# Patient Record
Sex: Male | Born: 1945 | Race: Black or African American | Hispanic: No | Marital: Married | Smoking: Never smoker
Health system: Southern US, Community
[De-identification: ages and names within clinical notes are randomized; demographics above are authoritative.]

## PROBLEM LIST (undated history)

## (undated) DIAGNOSIS — Z860100 Personal history of colon polyps, unspecified: Secondary | ICD-10-CM

## (undated) DIAGNOSIS — I10 Essential (primary) hypertension: Secondary | ICD-10-CM

## (undated) DIAGNOSIS — I2510 Atherosclerotic heart disease of native coronary artery without angina pectoris: Secondary | ICD-10-CM

## (undated) DIAGNOSIS — N419 Inflammatory disease of prostate, unspecified: Secondary | ICD-10-CM

## (undated) DIAGNOSIS — C61 Malignant neoplasm of prostate: Secondary | ICD-10-CM

## (undated) DIAGNOSIS — E785 Hyperlipidemia, unspecified: Secondary | ICD-10-CM

## (undated) DIAGNOSIS — I639 Cerebral infarction, unspecified: Secondary | ICD-10-CM

## (undated) DIAGNOSIS — R42 Dizziness and giddiness: Secondary | ICD-10-CM

## (undated) HISTORY — PX: COLONOSCOPY: SHX174

## (undated) HISTORY — DX: Personal history of colon polyps, unspecified: Z86.0100

## (undated) HISTORY — DX: Inflammatory disease of prostate, unspecified: N41.9

## (undated) HISTORY — DX: Essential (primary) hypertension: I10

## (undated) HISTORY — PX: PROSTATE BIOPSY: SHX241

## (undated) HISTORY — DX: Hyperlipidemia, unspecified: E78.5

## (undated) HISTORY — DX: Personal history of colonic polyps: Z86.010

## (undated) HISTORY — DX: Atherosclerotic heart disease of native coronary artery without angina pectoris: I25.10

## (undated) HISTORY — DX: Cerebral infarction, unspecified: I63.9

## (undated) HISTORY — DX: Dizziness and giddiness: R42

## (undated) HISTORY — PX: CORONARY ANGIOPLASTY WITH STENT PLACEMENT: SHX49

---

## 2000-03-23 LAB — HM COLONOSCOPY

## 2004-08-29 ENCOUNTER — Ambulatory Visit: Payer: Self-pay | Admitting: Family Medicine

## 2011-01-08 ENCOUNTER — Encounter: Payer: Self-pay | Admitting: Internal Medicine

## 2011-01-08 ENCOUNTER — Ambulatory Visit (INDEPENDENT_AMBULATORY_CARE_PROVIDER_SITE_OTHER): Payer: Medicare HMO | Admitting: Internal Medicine

## 2011-01-08 VITALS — BP 122/80 | HR 60 | Temp 98.1°F | Resp 16 | Ht 69.5 in | Wt 162.0 lb

## 2011-01-08 DIAGNOSIS — G4733 Obstructive sleep apnea (adult) (pediatric): Secondary | ICD-10-CM

## 2011-01-08 DIAGNOSIS — IMO0002 Reserved for concepts with insufficient information to code with codable children: Secondary | ICD-10-CM

## 2011-01-08 DIAGNOSIS — Z0000 Encounter for general adult medical examination without abnormal findings: Secondary | ICD-10-CM

## 2011-01-08 DIAGNOSIS — Z8673 Personal history of transient ischemic attack (TIA), and cerebral infarction without residual deficits: Secondary | ICD-10-CM

## 2011-01-08 DIAGNOSIS — E785 Hyperlipidemia, unspecified: Secondary | ICD-10-CM

## 2011-01-08 DIAGNOSIS — I2510 Atherosclerotic heart disease of native coronary artery without angina pectoris: Secondary | ICD-10-CM

## 2011-01-08 DIAGNOSIS — Z23 Encounter for immunization: Secondary | ICD-10-CM

## 2011-01-08 DIAGNOSIS — H8110 Benign paroxysmal vertigo, unspecified ear: Secondary | ICD-10-CM

## 2011-01-08 DIAGNOSIS — I10 Essential (primary) hypertension: Secondary | ICD-10-CM

## 2011-01-08 MED ORDER — ROSUVASTATIN CALCIUM 5 MG PO TABS: 5.0000 mg | ORAL_TABLET | Freq: Every day | ORAL | Status: DC

## 2011-01-08 MED ORDER — CLOPIDOGREL BISULFATE 75 MG PO TABS: 75.0000 mg | ORAL_TABLET | Freq: Every day | ORAL | Status: DC

## 2011-01-08 MED ORDER — VARDENAFIL HCL 20 MG PO TABS: 20.0000 mg | ORAL_TABLET | ORAL | Status: DC | PRN

## 2011-01-08 NOTE — Progress Notes (Signed)
Subjective:    Patient ID: Ian Roman, male    DOB: 1945/12/05, 65 y.o.   MRN: [id number]  HPI  65 year old patient who is seen today to establish with our practice. He states that he was evaluated in 07-Aug-2000 for a TIA. He has been on Plavix since that time. In Aug 07, 2005 he is status post coronary angiography with stenting of 2 vessels. He remains quite active and denies any cardiopulmonary symptoms. He has treated hypertension and dyslipidemia. He retired in June and has self discontinued metoprolol and hydrochlorothiazide.  1. Risk factors, based on past  M,S,F history. Patient has known coronary artery disease. Cardiovascular as factors include hypertension dyslipidemia  2.  Physical activities: Remains quite active without exercise restrictions exercises on a regular basis  3.  Depression/mood: No history depression or mood disorder 4.  Hearing: No deficits  5.  ADL's: Independent in all aspects of daily living  6.  Fall risk: Low  7.  Home safety: No problems identified  8.  Height weight, and visual acuity; height and weight stable no change in visual acuity  9.  Counseling: Heart healthy diet encouraged regular exercise recommended home blood pressure monitoring strongly encouraged  10. Lab orders based on risk factors: Will check lipid profile and general screening lab in the spring  11. Referral: Not appropriate at this time  12. Care plan:  13. Cognitive assessment:     Past medical history:  Coronary artery disease status post stenting 2005-08-07 History of TIA 08-07-00 Positional vertigo Hypertension Dyslipidemia Obstructive sleep apnea ED History pneumonia 08-07-2008 Chronic prostatitis Colonic polyps status post polypectomy 07-Aug-2000  Family history father died age 41 prostate cancer Mother died age 3 congestive heart failure 3 older sisters one died of a CNS neoplasm  Social history retired June 2012 25 year resident of [place] nonsmoker Married. One stepdaughter one  grandchild    Review of Systems  Constitutional: Negative for fever, chills, activity change, appetite change and fatigue.  HENT: Negative for hearing loss, ear pain, congestion, rhinorrhea, sneezing, mouth sores, trouble swallowing, neck pain, neck stiffness, dental problem, voice change, sinus pressure and tinnitus.        Chronic positional vertigo with lying on his right side only  Eyes: Negative for photophobia, pain, redness and visual disturbance.  Respiratory: Negative for apnea, cough, choking, chest tightness, shortness of breath and wheezing.   Cardiovascular: Negative for chest pain, palpitations and leg swelling.  Gastrointestinal: Negative for nausea, vomiting, abdominal pain, diarrhea, constipation, blood in stool, abdominal distention, anal bleeding and rectal pain.  Genitourinary: Negative for dysuria, urgency, frequency, hematuria, flank pain, decreased urine volume, discharge, penile swelling, scrotal swelling, difficulty urinating, genital sores and testicular pain.  Musculoskeletal: Negative for myalgias, back pain, joint swelling, arthralgias and gait problem.  Skin: Negative for color change, rash and wound.  Neurological: Negative for dizziness, tremors, seizures, syncope, facial asymmetry, speech difficulty, weakness, light-headedness, numbness and headaches.  Hematological: Negative for adenopathy. Does not bruise/bleed easily.  Psychiatric/Behavioral: Negative for suicidal ideas, hallucinations, behavioral problems, confusion, sleep disturbance, self-injury, dysphoric mood, decreased concentration and agitation. The patient is not nervous/anxious.        Objective:   Physical Exam  Constitutional: He appears well-developed and well-nourished.  HENT:  Head: Normocephalic and atraumatic.  Right Ear: External ear normal.  Left Ear: External ear normal.  Nose: Nose normal.  Mouth/Throat: Oropharynx is clear and moist.  Eyes: Conjunctivae and EOM are normal. Pupils  are equal, round, and reactive to light.  No scleral icterus.  Neck: Normal range of motion. Neck supple. No JVD present. No thyromegaly present.  Cardiovascular: Regular rhythm, normal heart sounds and intact distal pulses.  Exam reveals no gallop and no friction rub.   No murmur heard. Pulmonary/Chest: Effort normal and breath sounds normal. He exhibits no tenderness.  Abdominal: Soft. Bowel sounds are normal. He exhibits no distension and no mass. There is no tenderness.  Genitourinary: Prostate normal and penis normal.  Musculoskeletal: Normal range of motion. He exhibits no edema and no tenderness.  Lymphadenopathy:    He has no cervical adenopathy.  Neurological: He is alert. He has normal reflexes. No cranial nerve deficit. Coordination normal.  Skin: Skin is warm and dry. No rash noted.  Psychiatric: He has a normal mood and affect. His behavior is normal.          Assessment & Plan:   Preventive health examination Coronary artery disease Hypertension. His blood pressure is well controlled today off medications we'll continue close home but pressure monitoring low-salt diet Obstructive sleep apnea  Recheck in 6 months with fasting lab at that time. We'll schedule followup colonoscopy at that time

## 2011-01-08 NOTE — Patient Instructions (Signed)
It is important that you exercise regularly, at least 20 minutes 3 to 4 times per week.  If you develop chest pain or shortness of breath seek  medical attention.  Limit your sodium (Salt) intake  Schedule your colonoscopy to help detect colon cancer.  Return in 6 months for follow-up

## 2011-03-09 ENCOUNTER — Telehealth: Payer: Self-pay

## 2011-03-09 NOTE — Telephone Encounter (Signed)
Pt was on viagra 50 mg previously. Pt states he was on Levitra 20 mg but he needs 50 mg.  Pls advise.

## 2011-03-09 NOTE — Telephone Encounter (Signed)
Maximum dose of Viagra is 100 mg and maximum dose of Levitra is 20 mg. Which medication that he prefer??

## 2011-03-09 NOTE — Telephone Encounter (Signed)
Pt is aware of information.  Pt states he will stay on Levitra 20 mg for now and will call back if he needs to changed medications.

## 2011-03-19 ENCOUNTER — Other Ambulatory Visit: Payer: Self-pay | Admitting: Internal Medicine

## 2011-03-19 MED ORDER — METOPROLOL SUCCINATE ER 25 MG PO TB24: 25.0000 mg | ORAL_TABLET | Freq: Every day | ORAL | Status: DC

## 2011-03-19 NOTE — Telephone Encounter (Signed)
Not on current med list - was dc'd 12/2010 Please advise

## 2011-03-19 NOTE — Telephone Encounter (Signed)
RF  #90  RF 2

## 2011-03-19 NOTE — Telephone Encounter (Signed)
Pt requesting refill on  metoprolol tartrate (LOPRESSOR) 25 MG tablet   [facility] [place]

## 2011-04-01 DIAGNOSIS — K219 Gastro-esophageal reflux disease without esophagitis: Secondary | ICD-10-CM | POA: Insufficient documentation

## 2011-04-01 DIAGNOSIS — G459 Transient cerebral ischemic attack, unspecified: Secondary | ICD-10-CM | POA: Insufficient documentation

## 2011-04-01 DIAGNOSIS — N419 Inflammatory disease of prostate, unspecified: Secondary | ICD-10-CM | POA: Insufficient documentation

## 2011-06-09 ENCOUNTER — Encounter: Payer: Self-pay | Admitting: Internal Medicine

## 2011-06-09 ENCOUNTER — Ambulatory Visit (INDEPENDENT_AMBULATORY_CARE_PROVIDER_SITE_OTHER): Payer: Medicare HMO | Admitting: Internal Medicine

## 2011-06-09 VITALS — BP 120/80 | HR 68 | Temp 97.7°F | Wt 167.0 lb

## 2011-06-09 DIAGNOSIS — E785 Hyperlipidemia, unspecified: Secondary | ICD-10-CM

## 2011-06-09 DIAGNOSIS — R42 Dizziness and giddiness: Secondary | ICD-10-CM

## 2011-06-09 DIAGNOSIS — I2510 Atherosclerotic heart disease of native coronary artery without angina pectoris: Secondary | ICD-10-CM

## 2011-06-09 DIAGNOSIS — I10 Essential (primary) hypertension: Secondary | ICD-10-CM

## 2011-06-09 LAB — CBC WITH DIFFERENTIAL/PLATELET
Basophils Absolute: 0 10*3/uL (ref 0.0–0.1)
Eosinophils Relative: 2 % (ref 0.0–5.0)
Hemoglobin: 14 g/dL (ref 13.0–17.0)
Lymphocytes Relative: 23.1 % (ref 12.0–46.0)
Monocytes Relative: 9.4 % (ref 3.0–12.0)
Platelets: 220 10*3/uL (ref 150.0–400.0)
RDW: 11.4 % — ABNORMAL LOW (ref 11.5–14.6)
WBC: 6.1 10*3/uL (ref 4.5–10.5)

## 2011-06-09 LAB — COMPREHENSIVE METABOLIC PANEL
ALT: 21 U/L (ref 0–53)
CO2: 30 mEq/L (ref 19–32)
Calcium: 9.7 mg/dL (ref 8.4–10.5)
Chloride: 101 mEq/L (ref 96–112)
Creatinine, Ser: 1.1 mg/dL (ref 0.4–1.5)
GFR: 87.1 mL/min (ref >60.00)
Glucose, Bld: 94 mg/dL (ref 70–99)
Total Bilirubin: 1.2 mg/dL (ref 0.3–1.2)
Total Protein: 8.1 g/dL (ref 6.0–8.3)

## 2011-06-09 LAB — TSH: TSH: 0.64 u[IU]/mL (ref 0.35–5.50)

## 2011-06-09 LAB — LIPID PANEL
HDL: 43.3 mg/dL (ref >39.00)
Total CHOL/HDL Ratio: 4
Triglycerides: 83 mg/dL (ref 0.0–149.0)

## 2011-06-09 MED ORDER — METOPROLOL SUCCINATE ER 25 MG PO TB24: ORAL_TABLET | ORAL | Status: DC

## 2011-06-09 NOTE — Progress Notes (Signed)
  Subjective:    Patient ID: Ian Roman, male    DOB: June 10, 1945, 66 y.o.   MRN: [id number]  HPI  66 year old patient who has a history of coronary artery disease.  He established with our practice last fall and was resumed on metoprolol and hydrochlorothiazide. He continues to monitor home blood pressure readings with nice results. Last week while visiting in [place] he had 2 episodes of near syncope. These sound vasovagal with nausea diaphoresis associated with these episodes. Both occurred while eating breakfast.  He has had no recurrent symptoms over the past few days. Blood pressure readings have consistently been in a low normal range. He does have a history of positional vertigo but these episodes are different. Denies any exertional chest pain or shortness of breath.    Review of Systems  Constitutional: Negative for fever, chills, appetite change and fatigue.  HENT: Negative for hearing loss, ear pain, congestion, sore throat, trouble swallowing, neck stiffness, dental problem, voice change and tinnitus.   Eyes: Negative for pain, discharge and visual disturbance.  Respiratory: Negative for cough, chest tightness, wheezing and stridor.   Cardiovascular: Negative for chest pain, palpitations and leg swelling.  Gastrointestinal: Negative for nausea, vomiting, abdominal pain, diarrhea, constipation, blood in stool and abdominal distention.  Genitourinary: Negative for urgency, hematuria, flank pain, discharge, difficulty urinating and genital sores.  Musculoskeletal: Negative for myalgias, back pain, joint swelling, arthralgias and gait problem.  Skin: Negative for rash.  Neurological: Positive for weakness and light-headedness. Negative for dizziness, syncope, speech difficulty, numbness and headaches.  Hematological: Negative for adenopathy. Does not bruise/bleed easily.  Psychiatric/Behavioral: Negative for behavioral problems and dysphoric mood. The patient is not  nervous/anxious.        Objective:   Physical Exam  Constitutional: He is oriented to person, place, and time. He appears well-developed.  HENT:  Head: Normocephalic.  Right Ear: External ear normal.  Left Ear: External ear normal.  Eyes: Conjunctivae and EOM are normal.  Neck: Normal range of motion.  Cardiovascular: Normal rate, regular rhythm, normal heart sounds and intact distal pulses.   Pulmonary/Chest: Effort normal and breath sounds normal. No respiratory distress. He has no wheezes.  Abdominal: Bowel sounds are normal.  Musculoskeletal: Normal range of motion. He exhibits no edema and no tenderness.  Neurological: He is alert and oriented to person, place, and time.  Psychiatric: He has a normal mood and affect. His behavior is normal.          Assessment & Plan:   Vasovagal near syncope. We'll discontinue hydrochlorothiazide we'll check screening lab and EKG. Coronary artery disease stable Hypertension continue home blood pressure monitoring. Discontinue diuretic therapy

## 2011-06-09 NOTE — Patient Instructions (Addendum)
Discontinue hydrochlorothiazide  Please check your blood pressure on a regular basis.  If it is consistently greater than 150/90, please make an office appointment.    It is important that you exercise regularly, at least 20 minutes 3 to 4 times per week.  If you develop chest pain or shortness of breath seek  medical attention.  Decrease metoprolol to one half tablet daily

## 2011-07-10 ENCOUNTER — Encounter: Payer: Self-pay | Admitting: Internal Medicine

## 2011-07-10 ENCOUNTER — Ambulatory Visit (INDEPENDENT_AMBULATORY_CARE_PROVIDER_SITE_OTHER): Payer: Medicare HMO | Admitting: Internal Medicine

## 2011-07-10 VITALS — BP 110/70 | Temp 97.6°F | Wt 166.0 lb

## 2011-07-10 DIAGNOSIS — E785 Hyperlipidemia, unspecified: Secondary | ICD-10-CM

## 2011-07-10 DIAGNOSIS — I2510 Atherosclerotic heart disease of native coronary artery without angina pectoris: Secondary | ICD-10-CM

## 2011-07-10 MED ORDER — SILDENAFIL CITRATE 100 MG PO TABS: 100.0000 mg | ORAL_TABLET | ORAL | Status: DC | PRN

## 2011-07-10 NOTE — Patient Instructions (Signed)
It is important that you exercise regularly, at least 20 minutes 3 to 4 times per week.  If you develop chest pain or shortness of breath seek  medical attention.  Limit your sodium (Salt) intake  Return in 6 months for follow-up  

## 2011-07-10 NOTE — Progress Notes (Signed)
  Subjective:    Patient ID: Ian Roman, male    DOB: 03/27/1945, 66 y.o.   MRN: [id number]  HPI  is a 66 year old patient who is seen today for followup. He has a history of coronary artery disease hypertension and dyslipidemia. He is seen one-month ago with mild vasovagal symptoms. Blood pressure was low normal and diuretic therapy was discontinued. His blood pressure remains well-controlled off hydrochlorothiazide. He has had no recurrent symptoms. He does have some mild chronic vertigo that is unchanged. No significant orthostatic symptoms. His coronary artery disease and cerebrovascular disease has been stable    Review of Systems  Constitutional: Negative for fever, chills, appetite change and fatigue.  HENT: Negative for hearing loss, ear pain, congestion, sore throat, trouble swallowing, neck stiffness, dental problem, voice change and tinnitus.   Eyes: Negative for pain, discharge and visual disturbance.  Respiratory: Negative for cough, chest tightness, wheezing and stridor.   Cardiovascular: Negative for chest pain, palpitations and leg swelling.  Gastrointestinal: Negative for nausea, vomiting, abdominal pain, diarrhea, constipation, blood in stool and abdominal distention.  Genitourinary: Negative for urgency, hematuria, flank pain, discharge, difficulty urinating and genital sores.  Musculoskeletal: Negative for myalgias, back pain, joint swelling, arthralgias and gait problem.  Skin: Negative for rash.  Neurological: Positive for light-headedness. Negative for dizziness, syncope, speech difficulty, weakness, numbness and headaches.  Hematological: Negative for adenopathy. Does not bruise/bleed easily.  Psychiatric/Behavioral: Negative for behavioral problems and dysphoric mood. The patient is not nervous/anxious.        Objective:   Physical Exam  Constitutional: He is oriented to person, place, and time. He appears well-developed.       Blood pressure 100/70 sitting and  94/70 standing. No symptoms  HENT:  Head: Normocephalic.  Right Ear: External ear normal.  Left Ear: External ear normal.  Eyes: Conjunctivae and EOM are normal.  Neck: Normal range of motion.  Cardiovascular: Normal rate and normal heart sounds.   Pulmonary/Chest: Breath sounds normal.  Abdominal: Bowel sounds are normal.  Musculoskeletal: Normal range of motion. He exhibits no edema and no tenderness.  Neurological: He is alert and oriented to person, place, and time.  Psychiatric: He has a normal mood and affect. His behavior is normal.          Assessment & Plan:   Coronary artery disease Dyslipidemia  Patient has done well off hydrochlorothiazide this medication has been discontinued. We'll continue exercise program and active lifestyle. Medical regimen unchanged except Viagra will be substituted for Levitra which has been more effective. We'll see for his annual exam in 6 months

## 2011-08-24 ENCOUNTER — Ambulatory Visit (INDEPENDENT_AMBULATORY_CARE_PROVIDER_SITE_OTHER): Payer: Medicare HMO | Admitting: Internal Medicine

## 2011-08-24 ENCOUNTER — Encounter: Payer: Self-pay | Admitting: Internal Medicine

## 2011-08-24 VITALS — BP 114/70 | Temp 98.3°F | Wt 167.0 lb

## 2011-08-24 DIAGNOSIS — I2510 Atherosclerotic heart disease of native coronary artery without angina pectoris: Secondary | ICD-10-CM

## 2011-08-24 DIAGNOSIS — E785 Hyperlipidemia, unspecified: Secondary | ICD-10-CM

## 2011-08-24 DIAGNOSIS — I10 Essential (primary) hypertension: Secondary | ICD-10-CM

## 2011-08-24 NOTE — Patient Instructions (Signed)
Get plenty of rest, Drink lots of  clear liquids, and use Tylenol or ibuprofen for fever and discomfort.    Call or return to clinic prn if these symptoms worsen or fail to improve as anticipated.  

## 2011-08-24 NOTE — Progress Notes (Signed)
  Subjective:    Patient ID: Ian Roman, male    DOB: 1945/09/19, 66 y.o.   MRN: [id number]  HPI  66 year old patient who is followed for coronary artery disease dyslipidemia and hypertension. He has remote history of pneumonia. For the past 3 days, he has had chest congestion and cough;  cough described as thick white and occasionally yellow tinged. No shortness of breath,  wheezing chest pain. He is quite concerned about the possibility of recurrent pneumonia    Review of Systems  Constitutional: Negative for fever, chills, appetite change and fatigue.  HENT: Positive for congestion. Negative for hearing loss, ear pain, sore throat, trouble swallowing, neck stiffness, dental problem, voice change and tinnitus.   Eyes: Negative for pain, discharge and visual disturbance.  Respiratory: Positive for cough. Negative for chest tightness, wheezing and stridor.   Cardiovascular: Negative for chest pain, palpitations and leg swelling.  Gastrointestinal: Negative for nausea, vomiting, abdominal pain, diarrhea, constipation, blood in stool and abdominal distention.  Genitourinary: Negative for urgency, hematuria, flank pain, discharge, difficulty urinating and genital sores.  Musculoskeletal: Negative for myalgias, back pain, joint swelling, arthralgias and gait problem.  Skin: Negative for rash.  Neurological: Negative for dizziness, syncope, speech difficulty, weakness, numbness and headaches.  Hematological: Negative for adenopathy. Does not bruise/bleed easily.  Psychiatric/Behavioral: Negative for behavioral problems and dysphoric mood. The patient is not nervous/anxious.        Objective:   Physical Exam  Constitutional: He is oriented to person, place, and time. He appears well-developed.  HENT:  Head: Normocephalic.  Right Ear: External ear normal.  Left Ear: External ear normal.  Eyes: Conjunctivae and EOM are normal.  Neck: Normal range of motion.  Cardiovascular: Normal rate  and normal heart sounds.   Pulmonary/Chest: Breath sounds normal. No respiratory distress. He has no wheezes. He has no rales.        Oxygen saturation 97% Pulse rate is 67  Abdominal: Bowel sounds are normal.  Musculoskeletal: Normal range of motion. He exhibits no edema and no tenderness.  Neurological: He is alert and oriented to person, place, and time.  Psychiatric: He has a normal mood and affect. His behavior is normal.          Assessment & Plan:   Viral URI. Will treat symptomatically Hypertension stable Coronary artery disease stable

## 2011-08-31 ENCOUNTER — Ambulatory Visit (INDEPENDENT_AMBULATORY_CARE_PROVIDER_SITE_OTHER): Payer: Medicare HMO | Admitting: Internal Medicine

## 2011-08-31 ENCOUNTER — Encounter: Payer: Self-pay | Admitting: Internal Medicine

## 2011-08-31 VITALS — BP 120/70 | Temp 98.1°F | Wt 165.0 lb

## 2011-08-31 DIAGNOSIS — I10 Essential (primary) hypertension: Secondary | ICD-10-CM

## 2011-08-31 DIAGNOSIS — J40 Bronchitis, not specified as acute or chronic: Secondary | ICD-10-CM

## 2011-08-31 MED ORDER — HYDROCODONE-HOMATROPINE 5-1.5 MG/5ML PO SYRP: 5.0000 mL | ORAL_SOLUTION | Freq: Four times a day (QID) | ORAL | Status: DC | PRN

## 2011-08-31 MED ORDER — AZITHROMYCIN 250 MG PO TABS: ORAL_TABLET | ORAL | Status: AC

## 2011-08-31 NOTE — Progress Notes (Signed)
  Subjective:    Patient ID: Ian Roman, male    DOB: [birth year], 66 y.o.   MRN: [id number]  HPI 66 year old patient who is seen today in followup. He was seen 66 days ago with a three-day history of cough he was treated symptomatically. He continues to have cough congestion but no fever cough is been quite bothersome especially at night still largely nonproductive. No fever chills shortness of breath or chest pain. Has also noted some nasal congestion. He does have a history of OSA and prior history of pneumonia. He has treated coronary artery disease hypertension and dyslipidemia     Review of Systems  Constitutional: Positive for fatigue. Negative for fever, chills and appetite change.  HENT: Positive for congestion. Negative for hearing loss, ear pain, sore throat, trouble swallowing, neck stiffness, dental problem, voice change and tinnitus.   Eyes: Negative for pain, discharge and visual disturbance.  Respiratory: Positive for cough. Negative for chest tightness, wheezing and stridor.   Cardiovascular: Negative for chest pain, palpitations and leg swelling.  Gastrointestinal: Negative for nausea, vomiting, abdominal pain, diarrhea, constipation, blood in stool and abdominal distention.  Genitourinary: Negative for urgency, hematuria, flank pain, discharge, difficulty urinating and genital sores.  Musculoskeletal: Negative for myalgias, back pain, joint swelling, arthralgias and gait problem.  Skin: Negative for rash.  Neurological: Negative for dizziness, syncope, speech difficulty, weakness, numbness and headaches.  Hematological: Negative for adenopathy. Does not bruise/bleed easily.  Psychiatric/Behavioral: Negative for behavioral problems and dysphoric mood. The patient is not nervous/anxious.        Objective:   Physical Exam  Constitutional: He is oriented to person, place, and time. He appears well-developed.  HENT:  Head: Normocephalic.  Right Ear: External ear normal.    Left Ear: External ear normal.  Eyes: Conjunctivae and EOM are normal.  Neck: Normal range of motion.  Cardiovascular: Normal rate and normal heart sounds.   Pulmonary/Chest: Effort normal and breath sounds normal. No respiratory distress. He has no wheezes.       A few rhonchi O2 saturation 97% Pulse rate 70  Abdominal: Bowel sounds are normal.  Musculoskeletal: Normal range of motion. He exhibits no edema and no tenderness.  Neurological: He is alert and oriented to person, place, and time.  Psychiatric: He has a normal mood and affect. His behavior is normal.          Assessment & Plan:  URI/bronchitis. We'll treat with a Z-Pak. Continue symptomatic treatment with a cough suppressant Hypertension stable

## 2011-08-31 NOTE — Patient Instructions (Signed)
    Use saline irrigation, warm  moist compresses and over-the-counter decongestants only as directed.  Call if there is no improvement in 5 to 7 days, or sooner if you develop increasing pain, fever, or any new symptoms.  Take your antibiotic as prescribed until ALL of it is gone, but stop if you develop a rash, swelling, or any side effects of the medication.  Contact our office as soon as possible if  there are side effects of the medication. 

## 2011-12-02 ENCOUNTER — Encounter: Payer: Self-pay | Admitting: Internal Medicine

## 2011-12-02 ENCOUNTER — Ambulatory Visit (INDEPENDENT_AMBULATORY_CARE_PROVIDER_SITE_OTHER): Payer: Medicare HMO | Admitting: Internal Medicine

## 2011-12-02 VITALS — BP 132/70 | Temp 98.1°F | Ht 70.0 in | Wt 165.0 lb

## 2011-12-02 DIAGNOSIS — N139 Obstructive and reflux uropathy, unspecified: Secondary | ICD-10-CM

## 2011-12-02 DIAGNOSIS — N401 Enlarged prostate with lower urinary tract symptoms: Secondary | ICD-10-CM | POA: Insufficient documentation

## 2011-12-02 DIAGNOSIS — Z86010 Personal history of colonic polyps: Secondary | ICD-10-CM

## 2011-12-02 DIAGNOSIS — Z0000 Encounter for general adult medical examination without abnormal findings: Secondary | ICD-10-CM

## 2011-12-02 DIAGNOSIS — I2510 Atherosclerotic heart disease of native coronary artery without angina pectoris: Secondary | ICD-10-CM

## 2011-12-02 MED ORDER — ASPIRIN EC 81 MG PO TBEC: 81.0000 mg | DELAYED_RELEASE_TABLET | Freq: Every day | ORAL | Status: AC

## 2011-12-02 NOTE — Patient Instructions (Signed)
Limit your sodium (Salt) intake    It is important that you exercise regularly, at least 20 minutes 3 to 4 times per week.  If you develop chest pain or shortness of breath seek  medical attention.  Return in 6 months for follow-up  

## 2011-12-02 NOTE — Progress Notes (Signed)
Patient ID: Ian Roman, male   DOB: 1945-12-12, 66 y.o.   MRN: [id number]  Subjective:    Patient ID: Ian Roman, male    DOB: 01/04/1946, 66 y.o.   MRN: [id number]  Coronary Artery Disease Pertinent negatives include no chest pain, chest tightness, dizziness, leg swelling, palpitations or shortness of breath.  66 -year-old patient who is seen today for an annual examination. He states that he was evaluated in 2000-08-30 for a TIA. He has been on Plavix since that time. In Aug 30, 2005 he is status post coronary angiography with stenting of 2 vessels. He remains quite active and denies any cardiopulmonary symptoms. He has treated hypertension and dyslipidemia. He retired in June 2012 and has self discontinued hydrochlorothiazide.  He remains on metoprolol  He has a history of colonic polyps and last colonoscopy was in February of 2009  1. Risk factors, based on past  M,S,F history. Patient has known coronary artery disease. Cardiovascular as factors include hypertension dyslipidemia  2.  Physical activities: Remains quite active without exercise restrictions exercises on a regular basis  3.  Depression/mood: No history depression or mood disorder 4.  Hearing: No deficits  5.  ADL's: Independent in all aspects of daily living  6.  Fall risk: Low  7.  Home safety: No problems identified  8.  Height weight, and visual acuity; height and weight stable no change in visual acuity  9.  Counseling: Heart healthy diet encouraged regular exercise recommended home blood pressure monitoring strongly encouraged  10. Lab orders based on risk factors: Will check lipid profile and general screening lab in the spring  11. Referral: Not appropriate at this time  12. Care plan:  13. Cognitive assessment:     Past medical history:  Coronary artery disease status post stenting Aug 30, 2005 History of TIA 30-Aug-2000 Positional vertigo Hypertension Dyslipidemia Obstructive sleep apnea ED History pneumonia  30-Aug-2008 Chronic prostatitis Colonic polyps status post polypectomy 08/30/2000  Family history father died age 70  prostate cancer Mother died age 28 congestive heart failure 3 older sisters one died of a CNS neoplasm  Social history retired June 2012 25 year resident of [place] nonsmoker Married. One stepdaughter one grandchild    Review of Systems  Constitutional: Negative for fever, chills, activity change, appetite change and fatigue.  HENT: Negative for hearing loss, ear pain, congestion, rhinorrhea, sneezing, mouth sores, trouble swallowing, neck pain, neck stiffness, dental problem, voice change, sinus pressure and tinnitus.        Chronic positional vertigo with lying on his right side only  Eyes: Negative for photophobia, pain, redness and visual disturbance.  Respiratory: Negative for apnea, cough, choking, chest tightness, shortness of breath and wheezing.   Cardiovascular: Negative for chest pain, palpitations and leg swelling.  Gastrointestinal: Negative for nausea, vomiting, abdominal pain, diarrhea, constipation, blood in stool, abdominal distention, anal bleeding and rectal pain.  Genitourinary: Negative for dysuria, urgency, frequency, hematuria, flank pain, decreased urine volume, discharge, penile swelling, scrotal swelling, difficulty urinating, genital sores and testicular pain.  Musculoskeletal: Negative for myalgias, back pain, joint swelling, arthralgias and gait problem.  Skin: Negative for color change, rash and wound.  Neurological: Negative for dizziness, tremors, seizures, syncope, facial asymmetry, speech difficulty, weakness, light-headedness, numbness and headaches.  Hematological: Negative for adenopathy. Does not bruise/bleed easily.  Psychiatric/Behavioral: Negative for suicidal ideas, hallucinations, behavioral problems, confusion, disturbed wake/sleep cycle, self-injury, dysphoric mood, decreased concentration and agitation. The patient is not nervous/anxious.  Objective:   Physical Exam  Constitutional: He appears well-developed and well-nourished.  HENT:  Head: Normocephalic and atraumatic.  Right Ear: External ear normal.  Left Ear: External ear normal.  Nose: Nose normal.  Mouth/Throat: Oropharynx is clear and moist.  Eyes: Conjunctivae normal and EOM are normal. Pupils are equal, round, and reactive to light. No scleral icterus.  Neck: Normal range of motion. Neck supple. No JVD present. No thyromegaly present.  Cardiovascular: Regular rhythm, normal heart sounds and intact distal pulses.  Exam reveals no gallop and no friction rub.   No murmur heard. Pulmonary/Chest: Effort normal and breath sounds normal. He exhibits no tenderness.  Abdominal: Soft. Bowel sounds are normal. He exhibits no distension and no mass. There is no tenderness.  Genitourinary: Prostate normal and penis normal.  Musculoskeletal: Normal range of motion. He exhibits no edema and no tenderness.  Lymphadenopathy:    He has no cervical adenopathy.  Neurological: He is alert. He has normal reflexes. No cranial nerve deficit. Coordination normal.  Skin: Skin is warm and dry. No rash noted.  Psychiatric: He has a normal mood and affect. His behavior is normal.          Assessment & Plan:   Preventive health examination Coronary artery disease Hypertension. His blood pressure is well controlled today off medications we'll continue close home but pressure monitoring low-salt diet Obstructive sleep apnea  Recheck in 6 months with fasting lab at that time. We'll schedule followup colonoscopy at that time

## 2011-12-16 ENCOUNTER — Ambulatory Visit (INDEPENDENT_AMBULATORY_CARE_PROVIDER_SITE_OTHER): Payer: Medicare HMO | Admitting: Internal Medicine

## 2011-12-16 ENCOUNTER — Encounter: Payer: Self-pay | Admitting: Internal Medicine

## 2011-12-16 VITALS — BP 112/72 | Temp 97.9°F | Wt 166.0 lb

## 2011-12-16 DIAGNOSIS — Z23 Encounter for immunization: Secondary | ICD-10-CM

## 2011-12-16 DIAGNOSIS — I10 Essential (primary) hypertension: Secondary | ICD-10-CM

## 2011-12-16 DIAGNOSIS — M7022 Olecranon bursitis, left elbow: Secondary | ICD-10-CM

## 2011-12-16 DIAGNOSIS — M7020 Olecranon bursitis, unspecified elbow: Secondary | ICD-10-CM

## 2011-12-16 NOTE — Patient Instructions (Signed)
Call or return to clinic prn if these symptoms worsen or fail to improve as anticipated.

## 2011-12-16 NOTE — Progress Notes (Signed)
  Subjective:    Patient ID: Ian Roman, male    DOB: 1945-08-15, 66 y.o.   MRN: [id number]  HPI  66 year old patient who has a history of hypertension and coronary artery disease. He presents with a two-week history of pain and swelling involving his left elbow area. Activities include the multiple reps of arm curls with both elbows on a hard surface. He is right-handed. No pain or other inflammatory signs about the left elbow area  Past Medical History  Diagnosis Date  . Hyperlipidemia   . Hypertension   . Stroke     tia   . Prostatitis   . Coronary artery disease     Status post stenting x2 2007    History   Social History  . Marital Status: Married    Spouse Name: N/A    Number of Children: N/A  . Years of Education: N/A   Occupational History  . Not on file.   Social History Main Topics  . Smoking status: Never Smoker   . Smokeless tobacco: Never Used  . Alcohol Use: Yes  . Drug Use: No  . Sexually Active: Not on file   Other Topics Concern  . Not on file   Social History Narrative  . No narrative on file    Past Surgical History  Procedure Date  . Coronary angioplasty with stent placement     x2   2007    Family History  Problem Relation Age of Onset  . Heart disease Mother   . Cancer Father     prostate ca    No Known Allergies  Current Outpatient Prescriptions on File Prior to Visit  Medication Sig Dispense Refill  . aspirin EC 81 MG tablet Take 1 tablet (81 mg total) by mouth daily.      . clopidogrel (PLAVIX) 75 MG tablet Take 1 tablet (75 mg total) by mouth daily.  90 tablet  6  . metoprolol succinate (TOPROL-XL) 25 MG 24 hr tablet One half tablet daily  90 tablet  3  . NON FORMULARY CPAP machine       . rosuvastatin (CRESTOR) 5 MG tablet Take 1 tablet (5 mg total) by mouth daily.  90 tablet  6  . sildenafil (VIAGRA) 100 MG tablet Take 1 tablet (100 mg total) by mouth as needed for erectile dysfunction.  20 tablet  1    BP 112/72   Temp 97.9 F (36.6 C) (Oral)  Wt 166 lb (75.297 kg)       Review of Systems  Musculoskeletal: Positive for joint swelling.       Objective:   Physical Exam  Constitutional: He appears well-developed and well-nourished. No distress.       Afebrile Blood pressure low normal  Musculoskeletal:       Swelling about the left elbow area. No tenderness, excess  warmth or erythema          Assessment & Plan:   Left olecranon bursitis.  No evidence of active inflammation or infection.  Will avoid overuse and trauma and observe over the next 2-4 weeks. If unimproved will consider drainage and local cortisone injection. Hypertension stable

## 2012-02-08 ENCOUNTER — Other Ambulatory Visit: Payer: Self-pay | Admitting: Internal Medicine

## 2012-02-08 NOTE — Telephone Encounter (Signed)
Med filled.  

## 2012-04-06 ENCOUNTER — Telehealth: Payer: Self-pay | Admitting: Internal Medicine

## 2012-04-06 DIAGNOSIS — E782 Mixed hyperlipidemia: Secondary | ICD-10-CM | POA: Insufficient documentation

## 2012-04-06 NOTE — Telephone Encounter (Signed)
Spoke to pt wanted result of PSA done in Sept. Told pt is was normal copy mailed to pt at his request.

## 2012-04-06 NOTE — Telephone Encounter (Signed)
Pt would like blood work results °

## 2012-05-09 ENCOUNTER — Telehealth: Payer: Self-pay | Admitting: Internal Medicine

## 2012-05-09 NOTE — Telephone Encounter (Signed)
[facility] Triage Call Report Triage Record Num: [id number] Operator: Remonia Richter Patient Name: Ian Roman Call Date & Time: 05/05/2012 5:41:30PM Patient Phone: [phone] PCP: Gordy Savers Patient Gender: Male PCP Fax : [phone] Patient DOB: Aug 10, 1945 [facility] Name: [facility] Reason for Call: Caller: Ian Roman/Patient; PCP: Eleonore Chiquito (Family Practice); CB#: [id number]; Call regarding Chest Pain/Chest Discomfort; started this am on waking, "small pain sensation that runs up to shoulder", fels a flutter of pain q 10 minutes,911 disposition obtained due to report of chest pain, he says he is just calling to get an appointment to see MD in about this and upset that appt cannot be scheduled,caller short with explanation of why he should be seen in the ED and got off line. Protocol(s) Used: Chest Pain Recommended Outcome per Protocol: Activate EMS 911 Reason for Outcome: Pressure, fullness, squeezing sensation or pain anywhere in the chest lasting 5 or more minutes now or within the last hour. Pain is NOT associated with taking a deep breath or a productive cough, movement, or touch to a localized area on the chest. Care Advice: ~ IMMEDIATE ACTION Write down provider's name. List or place the following in a bag for transport with the patient: current prescription and/or nonprescription medications; alternative treatments, therapies and medications; and street drugs. ~ After calling EMS 911, have the person chew one aspirin tablet (325 mg), or 4 baby aspirin (81mg ) with a small amount of water now if conscious, not allergic to aspirin, or if has not been told to avoid taking aspirin by their provider. It is important to use aspirin, not acetaminophen. ~ ~ Tell providers if you are taking an erectile dysfunction drug such as Viagra(R), Cialis(R), Levitra(R). An adult should stay with the patient, preferably one trained in CPR. If the person is not  trained in CPR, then he or she should provide hands-only (compression-only) CPR as recommended by the American Heart Association. ~ 02/13/

## 2013-04-06 ENCOUNTER — Other Ambulatory Visit (INDEPENDENT_AMBULATORY_CARE_PROVIDER_SITE_OTHER): Payer: Medicare HMO

## 2013-04-06 DIAGNOSIS — Z0000 Encounter for general adult medical examination without abnormal findings: Secondary | ICD-10-CM

## 2013-04-06 LAB — POCT URINALYSIS DIPSTICK
BILIRUBIN UA: NEGATIVE
GLUCOSE UA: NEGATIVE
KETONES UA: NEGATIVE
Leukocytes, UA: NEGATIVE
NITRITE UA: NEGATIVE
PH UA: 6
Protein, UA: NEGATIVE
RBC UA: NEGATIVE
Spec Grav, UA: <=1.005
Urobilinogen, UA: 0.2

## 2013-04-06 LAB — HEPATIC FUNCTION PANEL
ALBUMIN: 4 g/dL (ref 3.5–5.2)
ALT: 23 U/L (ref 0–53)
AST: 22 U/L (ref 0–37)
Alkaline Phosphatase: 64 U/L (ref 39–117)
BILIRUBIN TOTAL: 1.4 mg/dL — AB (ref 0.3–1.2)
Bilirubin, Direct: 0.2 mg/dL (ref 0.0–0.3)
Total Protein: 7.3 g/dL (ref 6.0–8.3)

## 2013-04-06 LAB — CBC WITH DIFFERENTIAL/PLATELET
BASOS ABS: 0 10*3/uL (ref 0.0–0.1)
BASOS PCT: 0.4 % (ref 0.0–3.0)
EOS ABS: 0.1 10*3/uL (ref 0.0–0.7)
Eosinophils Relative: 2.1 % (ref 0.0–5.0)
HCT: 40.5 % (ref 39.0–52.0)
Hemoglobin: 13.7 g/dL (ref 13.0–17.0)
LYMPHS PCT: 20.7 % (ref 12.0–46.0)
Lymphs Abs: 1.1 10*3/uL (ref 0.7–4.0)
MCHC: 33.8 g/dL (ref 30.0–36.0)
MCV: 98.2 fl (ref 78.0–100.0)
MONO ABS: 0.5 10*3/uL (ref 0.1–1.0)
Monocytes Relative: 10 % (ref 3.0–12.0)
NEUTROS PCT: 66.8 % (ref 43.0–77.0)
Neutro Abs: 3.4 10*3/uL (ref 1.4–7.7)
PLATELETS: 220 10*3/uL (ref 150.0–400.0)
RBC: 4.12 Mil/uL — AB (ref 4.22–5.81)
RDW: 11.7 % (ref 11.5–14.6)
WBC: 5.2 10*3/uL (ref 4.5–10.5)

## 2013-04-06 LAB — LIPID PANEL
CHOL/HDL RATIO: 4
CHOLESTEROL: 144 mg/dL (ref 0–200)
HDL: 39.1 mg/dL (ref >39.00)
LDL Cholesterol: 90 mg/dL (ref 0–99)
TRIGLYCERIDES: 74 mg/dL (ref 0.0–149.0)
VLDL: 14.8 mg/dL (ref 0.0–40.0)

## 2013-04-06 LAB — BASIC METABOLIC PANEL
BUN: 15 mg/dL (ref 6–23)
CALCIUM: 9.2 mg/dL (ref 8.4–10.5)
CHLORIDE: 102 meq/L (ref 96–112)
CO2: 30 meq/L (ref 19–32)
CREATININE: 1.1 mg/dL (ref 0.4–1.5)
GFR: 87.54 mL/min (ref >60.00)
GLUCOSE: 96 mg/dL (ref 70–99)
Potassium: 4.6 mEq/L (ref 3.5–5.1)
Sodium: 137 mEq/L (ref 135–145)

## 2013-04-06 LAB — PSA: PSA: 4.31 ng/mL — ABNORMAL HIGH (ref 0.10–4.00)

## 2013-04-06 LAB — TSH: TSH: 1.51 u[IU]/mL (ref 0.35–5.50)

## 2013-04-13 ENCOUNTER — Encounter: Payer: Self-pay | Admitting: Internal Medicine

## 2013-04-13 ENCOUNTER — Ambulatory Visit (INDEPENDENT_AMBULATORY_CARE_PROVIDER_SITE_OTHER): Payer: Medicare HMO | Admitting: Internal Medicine

## 2013-04-13 VITALS — BP 120/80 | HR 57 | Temp 98.3°F | Resp 20 | Ht 70.0 in | Wt 176.0 lb

## 2013-04-13 DIAGNOSIS — N138 Other obstructive and reflux uropathy: Secondary | ICD-10-CM

## 2013-04-13 DIAGNOSIS — Z860100 Personal history of colon polyps, unspecified: Secondary | ICD-10-CM

## 2013-04-13 DIAGNOSIS — Z23 Encounter for immunization: Secondary | ICD-10-CM

## 2013-04-13 DIAGNOSIS — N139 Obstructive and reflux uropathy, unspecified: Secondary | ICD-10-CM

## 2013-04-13 DIAGNOSIS — E785 Hyperlipidemia, unspecified: Secondary | ICD-10-CM

## 2013-04-13 DIAGNOSIS — I10 Essential (primary) hypertension: Secondary | ICD-10-CM

## 2013-04-13 DIAGNOSIS — I2510 Atherosclerotic heart disease of native coronary artery without angina pectoris: Secondary | ICD-10-CM

## 2013-04-13 DIAGNOSIS — R972 Elevated prostate specific antigen [PSA]: Secondary | ICD-10-CM | POA: Insufficient documentation

## 2013-04-13 DIAGNOSIS — Z0000 Encounter for general adult medical examination without abnormal findings: Secondary | ICD-10-CM

## 2013-04-13 DIAGNOSIS — N401 Enlarged prostate with lower urinary tract symptoms: Secondary | ICD-10-CM

## 2013-04-13 MED ORDER — METOPROLOL SUCCINATE ER 25 MG PO TB24: 25.0000 mg | ORAL_TABLET | Freq: Every day | ORAL | Status: DC

## 2013-04-13 MED ORDER — FLUTICASONE PROPIONATE 50 MCG/ACT NA SUSP: 2.0000 | Freq: Every day | NASAL | Status: DC

## 2013-04-13 MED ORDER — CLOPIDOGREL BISULFATE 75 MG PO TABS: ORAL_TABLET | ORAL | Status: DC

## 2013-04-13 MED ORDER — SILDENAFIL CITRATE 100 MG PO TABS: 100.0000 mg | ORAL_TABLET | ORAL | Status: DC | PRN

## 2013-04-13 MED ORDER — HYDROCODONE-HOMATROPINE 5-1.5 MG/5ML PO SYRP: 5.0000 mL | ORAL_SOLUTION | Freq: Four times a day (QID) | ORAL | Status: AC | PRN

## 2013-04-13 MED ORDER — ROSUVASTATIN CALCIUM 5 MG PO TABS: 5.0000 mg | ORAL_TABLET | Freq: Every day | ORAL | Status: DC

## 2013-04-13 NOTE — Progress Notes (Signed)
Pre-visit discussion using our clinic review tool. No additional management support is needed unless otherwise documented below in the visit note.  

## 2013-04-13 NOTE — Patient Instructions (Signed)
Limit your sodium (Salt) intake    It is important that you exercise regularly, at least 20 minutes 3 to 4 times per week.  If you develop chest pain or shortness of breath seek  medical attention.  Please check your blood pressure on a regular basis.  If it is consistently greater than 150/90, please make an office appointment.  Recheck PSA in 4 months

## 2013-04-13 NOTE — Progress Notes (Signed)
Patient ID: NYAIR DEPAULO, male   DOB: 04/06/1945, 68 y.o.   MRN: [id number]  Subjective:    Patient ID: SOTERO BRINKMEYER, male    DOB: 1946-01-13, 68 y.o.   MRN: [id number]  Coronary Artery Disease Pertinent negatives include no chest pain, chest tightness, dizziness, leg swelling, palpitations or shortness of breath.   43 -year-old patient who is seen today for an annual examination. He states that he was evaluated in 29-Jun-2000 for a TIA. He has been on Plavix since that time. In 06/29/05 he is status post coronary angiography with stenting of 2 vessels. He remains quite active and denies any cardiopulmonary symptoms. He has treated hypertension and dyslipidemia. He retired in June 2012 and has self discontinued hydrochlorothiazide.  He remains on metoprolol  He has a history of colonic polyps and last colonoscopy was in February of 2009  1. Risk factors, based on past  M,S,F history. Patient has known coronary artery disease. Cardiovascular as factors include hypertension dyslipidemia  2.  Physical activities: Remains quite active without exercise restrictions;  exercises on a regular basis  3.  Depression/mood: No history depression or mood disorder 4.  Hearing: No deficits  5.  ADL's: Independent in all aspects of daily living  6.  Fall risk: Low  7.  Home safety: No problems identified  8.  Height weight, and visual acuity; height and weight stable no change in visual acuity  9.  Counseling: Heart healthy diet encouraged regular exercise recommended home blood pressure monitoring strongly encouraged  10. Lab orders based on risk factors: Will check lipid profile and general screening lab in the spring  11. Referral: Not appropriate at this time  12. Care plan:  Follow up colonoscopy  13. Cognitive assessment:     Past medical history:  Coronary artery disease status post stenting June 29, 2005 History of TIA 06/29/2000 Positional vertigo Hypertension Dyslipidemia Obstructive sleep  apnea ED History pneumonia 2008-06-29 Chronic prostatitis Colonic polyps status post polypectomy 29-Jun-2000 colonoscopy 2007-06-30   Family history father died age 8  prostate cancer Mother died age 22 congestive heart failure 3 older sisters one died of a CNS neoplasm  Social history retired June 2012 25 year resident of [place] nonsmoker Married. One stepdaughter one grandchild    Review of Systems  Constitutional: Negative for fever, chills, activity change, appetite change and fatigue.  HENT: Negative for congestion, dental problem, ear pain, hearing loss, mouth sores, rhinorrhea, sinus pressure, sneezing, tinnitus, trouble swallowing and voice change.        Chronic positional vertigo with lying on his right side only  Eyes: Negative for photophobia, pain, redness and visual disturbance.  Respiratory: Negative for apnea, cough, choking, chest tightness, shortness of breath and wheezing.   Cardiovascular: Negative for chest pain, palpitations and leg swelling.  Gastrointestinal: Negative for nausea, vomiting, abdominal pain, diarrhea, constipation, blood in stool, abdominal distention, anal bleeding and rectal pain.  Genitourinary: Negative for dysuria, urgency, frequency, hematuria, flank pain, decreased urine volume, discharge, penile swelling, scrotal swelling, difficulty urinating, genital sores and testicular pain.  Musculoskeletal: Negative for arthralgias, back pain, gait problem, joint swelling, myalgias, neck pain and neck stiffness.  Skin: Negative for color change, rash and wound.  Neurological: Negative for dizziness, tremors, seizures, syncope, facial asymmetry, speech difficulty, weakness, light-headedness, numbness and headaches.  Hematological: Negative for adenopathy. Does not bruise/bleed easily.  Psychiatric/Behavioral: Negative for suicidal ideas, hallucinations, behavioral problems, confusion, sleep disturbance, self-injury, dysphoric mood, decreased concentration and  agitation. The patient is not  nervous/anxious.        Objective:   Physical Exam  Constitutional: He appears well-developed and well-nourished.  HENT:  Head: Normocephalic and atraumatic.  Right Ear: External ear normal.  Left Ear: External ear normal.  Nose: Nose normal.  Mouth/Throat: Oropharynx is clear and moist.  Eyes: Conjunctivae and EOM are normal. Pupils are equal, round, and reactive to light. No scleral icterus.  Neck: Normal range of motion. Neck supple. No JVD present. No thyromegaly present.  Cardiovascular: Regular rhythm, normal heart sounds and intact distal pulses.  Exam reveals no gallop and no friction rub.   No murmur heard. Pulmonary/Chest: Effort normal and breath sounds normal. He exhibits no tenderness.  Abdominal: Soft. Bowel sounds are normal. He exhibits no distension and no mass. There is no tenderness.  Genitourinary: Prostate normal and penis normal.  Musculoskeletal: Normal range of motion. He exhibits no edema and no tenderness.  Lymphadenopathy:    He has no cervical adenopathy.  Neurological: He is alert. He has normal reflexes. No cranial nerve deficit. Coordination normal.  Skin: Skin is warm and dry. No rash noted.  Psychiatric: He has a normal mood and affect. His behavior is normal.          Assessment & Plan:   Preventive health examination Coronary artery disease Hypertension. His blood pressure is well controlled today; we'll continue close home blood  pressure monitoring low-salt diet Obstructive sleep apnea  Followup colonoscopy

## 2013-04-14 ENCOUNTER — Other Ambulatory Visit: Payer: Self-pay | Admitting: *Deleted

## 2013-04-14 DIAGNOSIS — I10 Essential (primary) hypertension: Secondary | ICD-10-CM

## 2013-04-14 MED ORDER — METOPROLOL SUCCINATE ER 25 MG PO TB24: 25.0000 mg | ORAL_TABLET | Freq: Every day | ORAL | Status: DC

## 2013-04-21 ENCOUNTER — Telehealth: Payer: Self-pay | Admitting: Internal Medicine

## 2013-04-21 DIAGNOSIS — Z86010 Personal history of colonic polyps: Secondary | ICD-10-CM

## 2013-04-21 DIAGNOSIS — Z1211 Encounter for screening for malignant neoplasm of colon: Secondary | ICD-10-CM

## 2013-04-21 NOTE — Telephone Encounter (Signed)
Patient would like a call back to discuss having a colonoscopy done. When he was here for a physical with Dr. Inda Merlin they started talking about having a colonoscopy but never finished the conversation.

## 2013-04-21 NOTE — Telephone Encounter (Signed)
Spoke to pt told him Dr. Raliegh Ip did put in his note refer for Colonoscopy and I will put order in and someone will contact him to schedule. Pt verbalized understanding. Order for Colonoscopy done.

## 2013-04-26 ENCOUNTER — Telehealth: Payer: Self-pay | Admitting: Internal Medicine

## 2013-04-26 NOTE — Telephone Encounter (Signed)
Relevant patient education mailed to patient.  

## 2013-05-29 ENCOUNTER — Encounter: Payer: Self-pay | Admitting: Internal Medicine

## 2013-06-05 ENCOUNTER — Encounter: Payer: Self-pay | Admitting: Nurse Practitioner

## 2013-06-13 ENCOUNTER — Ambulatory Visit: Payer: Medicare HMO | Admitting: Nurse Practitioner

## 2013-07-21 ENCOUNTER — Other Ambulatory Visit: Payer: Self-pay | Admitting: Internal Medicine

## 2013-08-11 ENCOUNTER — Ambulatory Visit (INDEPENDENT_AMBULATORY_CARE_PROVIDER_SITE_OTHER): Payer: Medicare HMO | Admitting: Internal Medicine

## 2013-08-11 ENCOUNTER — Encounter: Payer: Self-pay | Admitting: Internal Medicine

## 2013-08-11 VITALS — BP 110/80 | HR 78 | Temp 98.6°F | Wt 175.0 lb

## 2013-08-11 DIAGNOSIS — N138 Other obstructive and reflux uropathy: Secondary | ICD-10-CM

## 2013-08-11 DIAGNOSIS — N139 Obstructive and reflux uropathy, unspecified: Secondary | ICD-10-CM

## 2013-08-11 DIAGNOSIS — R972 Elevated prostate specific antigen [PSA]: Secondary | ICD-10-CM

## 2013-08-11 DIAGNOSIS — N401 Enlarged prostate with lower urinary tract symptoms: Secondary | ICD-10-CM

## 2013-08-11 DIAGNOSIS — I10 Essential (primary) hypertension: Secondary | ICD-10-CM

## 2013-08-11 LAB — PSA: PSA: 3.08 ng/mL (ref 0.10–4.00)

## 2013-08-11 NOTE — Progress Notes (Signed)
Pre visit review using our clinic review tool, if applicable. No additional management support is needed unless otherwise documented below in the visit note. 

## 2013-08-11 NOTE — Patient Instructions (Signed)
Limit your sodium (Salt) intake    It is important that you exercise regularly, at least 20 minutes 3 to 4 times per week.  If you develop chest pain or shortness of breath seek  medical attention.  Return in one year for follow-up   

## 2013-08-11 NOTE — Progress Notes (Signed)
Subjective:    Patient ID: Ian Roman, male    DOB: November 20, 1945, 68 y.o.   MRN: [id number]  HPI  68 year old patient who is seen today in followup.  He was seen 4 months ago for a preventive health examination.  He has a history of BPH and was noted to have a slight increase in PSA.  He has hypertension, which has been well-controlled.  No new concerns or complaints.  Past Medical History  Diagnosis Date  . Hyperlipidemia   . Hypertension   . Stroke     tia   . Prostatitis   . Coronary artery disease     Status post stenting x2 2007    History   Social History  . Marital Status: Married    Spouse Name: N/A    Number of Children: N/A  . Years of Education: N/A   Occupational History  . Not on file.   Social History Main Topics  . Smoking status: Never Smoker   . Smokeless tobacco: Never Used  . Alcohol Use: Yes  . Drug Use: No  . Sexual Activity: Not on file   Other Topics Concern  . Not on file   Social History Narrative  . No narrative on file    Past Surgical History  Procedure Laterality Date  . Coronary angioplasty with stent placement      x2   2007    Family History  Problem Relation Age of Onset  . Heart disease Mother   . Cancer Father     prostate ca    No Known Allergies  Current Outpatient Prescriptions on File Prior to Visit  Medication Sig Dispense Refill  . clopidogrel (PLAVIX) 75 MG tablet TAKE ONE TABLET BY MOUTH EVERY DAY  90 tablet  3  . fluticasone (FLONASE) 50 MCG/ACT nasal spray Place 2 sprays into both nostrils daily.  16 g  6  . metoprolol succinate (TOPROL-XL) 25 MG 24 hr tablet Take 1 tablet (25 mg total) by mouth daily.  90 tablet  3  . NON FORMULARY CPAP machine       . rosuvastatin (CRESTOR) 5 MG tablet Take 1 tablet (5 mg total) by mouth daily.  90 tablet  3  . VIAGRA 100 MG tablet TAKE ONE TABLET BY MOUTH AS NEEDED FOR  ERECTILE  DYSFUNCTION  20 tablet  2   No current facility-administered medications on file prior  to visit.    BP 110/80  Pulse 78  Temp(Src) 98.6 F (37 C) (Oral)  Wt 175 lb (79.379 kg)  SpO2 97%       Review of Systems  Constitutional: Negative for fever, chills, appetite change and fatigue.  HENT: Negative for congestion, dental problem, ear pain, hearing loss, sore throat, tinnitus, trouble swallowing and voice change.   Eyes: Negative for pain, discharge and visual disturbance.  Respiratory: Negative for cough, chest tightness, wheezing and stridor.   Cardiovascular: Negative for chest pain, palpitations and leg swelling.  Gastrointestinal: Negative for nausea, vomiting, abdominal pain, diarrhea, constipation, blood in stool and abdominal distention.  Genitourinary: Negative for urgency, hematuria, flank pain, discharge, difficulty urinating and genital sores.  Musculoskeletal: Negative for arthralgias, back pain, gait problem, joint swelling, myalgias and neck stiffness.  Skin: Negative for rash.  Neurological: Negative for dizziness, syncope, speech difficulty, weakness, numbness and headaches.  Hematological: Negative for adenopathy. Does not bruise/bleed easily.  Psychiatric/Behavioral: Negative for behavioral problems and dysphoric mood. The patient is not nervous/anxious.  Objective:   Physical Exam  Constitutional: He is oriented to person, place, and time. He appears well-developed.  HENT:  Head: Normocephalic.  Right Ear: External ear normal.  Left Ear: External ear normal.  Eyes: Conjunctivae and EOM are normal.  Neck: Normal range of motion.  Cardiovascular: Normal rate and normal heart sounds.   Pulmonary/Chest: Breath sounds normal.  Abdominal: Bowel sounds are normal.  Musculoskeletal: Normal range of motion. He exhibits no edema and no tenderness.  Neurological: He is alert and oriented to person, place, and time.  Psychiatric: He has a normal mood and affect. His behavior is normal.          Assessment & Plan:   Hypertension well  controlled BPH Elevated PSA.  We'll repeat if this has worsened.  Will set up for neurological evaluation

## 2013-09-28 ENCOUNTER — Telehealth: Payer: Self-pay | Admitting: *Deleted

## 2013-09-28 ENCOUNTER — Encounter: Payer: Self-pay | Admitting: Nurse Practitioner

## 2013-09-28 ENCOUNTER — Ambulatory Visit (INDEPENDENT_AMBULATORY_CARE_PROVIDER_SITE_OTHER): Payer: Commercial Managed Care - HMO | Admitting: Nurse Practitioner

## 2013-09-28 VITALS — BP 116/70 | HR 60 | Ht 70.0 in | Wt 174.6 lb

## 2013-09-28 DIAGNOSIS — Z86010 Personal history of colonic polyps: Secondary | ICD-10-CM

## 2013-09-28 DIAGNOSIS — Z7901 Long term (current) use of anticoagulants: Secondary | ICD-10-CM

## 2013-09-28 MED ORDER — NA SULFATE-K SULFATE-MG SULF 17.5-3.13-1.6 GM/177ML PO SOLN: 1.0000 | Freq: Once | ORAL | Status: DC

## 2013-09-28 NOTE — Patient Instructions (Signed)
We will call you once we hear from Dr. Burnice Logan regarding the Plavix, You have been scheduled for a colonoscopy. Please follow written instructions given to you at your visit today.   We have given you a sample prep. You will not need to get it from the pharmacy. If you use inhalers (even only as needed), please bring them with you on the day of your procedure. Your physician has requested that you go to www.startemmi.com and enter the access code given to you at your visit today. This web site gives a general overview about your procedure. However, you should still follow specific instructions given to you by our office regarding your preparation for the procedure.

## 2013-09-28 NOTE — Telephone Encounter (Signed)
Suggested discontinuation of Plavix 5 days prior to colonoscopy

## 2013-09-28 NOTE — Telephone Encounter (Signed)
09/28/2013   RE: Ian Roman DOB: 1946/03/09 MRN: [id number]   Dear Dr. Bluford Kaufmann,    We have scheduled the above patient for an endoscopic procedure. Our records show that he is on anticoagulation therapy.   Please advise as to how long the patient may come off his therapy of Palvix prior to the procedure, which is scheduled for 10-11-2013.  Please fax back/ or route the completed form to [place] at [phone].   Sincerely,    Tye Savoy NP-C    [place]

## 2013-09-28 NOTE — Progress Notes (Signed)
HPI :  Patient is a 68 year old male, new to this practice. He is here to discuss colonoscopy for history of colon polyps. Patient relocated to [facility] from [place] a few years ago. His last colonoscopy for polyp surveillance was done at [facility] Feb 2009, no polyps or other abnormalities found. Patient was advised to have a repeat surveillance colonoscopy at 5 years.  Patient denies abdominal pain or weight loss. He does have occasional rectal bleeding with bowel movements (scant). He also admits to occasional constipation. No other gastrointestinal complaints. No family history of colon cancer  Patient is on Plavix, he has a remote history of a TIA. He also has a remote history of coronary artery disease, status post placement of 2 stents. Patient has no chest pains or shortness of breath. Overall he feels very well.  Past Medical History  Diagnosis Date  . Hyperlipidemia   . Hypertension   . Stroke     tia   . Prostatitis   . Coronary artery disease     Status post stenting x2 2007    Family History  Problem Relation Age of Onset  . Heart disease Mother   . Cancer Father     prostate ca   History  Substance Use Topics  . Smoking status: Never Smoker   . Smokeless tobacco: Never Used  . Alcohol Use: Yes   Current Outpatient Prescriptions  Medication Sig Dispense Refill  . aspirin 81 MG tablet Take 81 mg by mouth daily.      . clopidogrel (PLAVIX) 75 MG tablet TAKE ONE TABLET BY MOUTH EVERY DAY  90 tablet  3  . co-enzyme Q-10 30 MG capsule Take 30 mg by mouth daily.      . Flaxseed, Linseed, (FLAXSEED OIL) 1000 MG CAPS Take by mouth.      . fluticasone (FLONASE) 50 MCG/ACT nasal spray Place 2 sprays into both nostrils daily.  16 g  6  . Lycopene 10 MG CAPS Take 1 capsule by mouth daily.      . metoprolol succinate (TOPROL-XL) 25 MG 24 hr tablet Take 1 tablet (25 mg total) by mouth daily.  90 tablet  3  . Multiple Vitamin tablet Take by mouth.      . NON FORMULARY CPAP  machine       . rosuvastatin (CRESTOR) 5 MG tablet Take 1 tablet (5 mg total) by mouth daily.  90 tablet  3  . VIAGRA 100 MG tablet TAKE ONE TABLET BY MOUTH AS NEEDED FOR  ERECTILE  DYSFUNCTION  20 tablet  2   No current facility-administered medications for this visit.   Allergies  Allergen Reactions  . Zocor [Simvastatin]     Intolerance   Review of Systems: All systems reviewed and negative except where noted in HPI.   Physical Exam: BP 116/70  Pulse 60  Ht 5\' 10"  (1.778 m)  Wt 174 lb 9.6 oz (79.198 kg)  BMI 25.05 kg/m2 Constitutional: Pleasant,well-developed, black male in no acute distress. HEENT: Normocephalic and atraumatic. Conjunctivae are normal. No scleral icterus. Neck supple.  Cardiovascular: Normal rate, regular rhythm.  Pulmonary/chest: Effort normal and breath sounds normal. No wheezing, rales or rhonchi. Abdominal: Soft, nondistended, nontender. Bowel sounds active throughout. There are no masses palpable. No hepatomegaly. Extremities: trace bilateral lower extremityedema Lymphadenopathy: No cervical adenopathy noted. Neurological: Alert and oriented to person place and time. Skin: Skin is warm and dry. No rashes noted. Psychiatric: Normal mood and affect. Behavior is normal.  ASSESSMENT AND PLAN:  1. very pleasant 68 year old male with history of colon polyps. Last surveillance exam 2009 at [facility] was normal, no polyps found. Patient advised to have repeat colonoscopy in 5 years. Patient has since relocated from [place] to [place]. Will proceed with surveillance colonoscopy. The risks, benefits, and alternatives to colonoscopy with possible biopsy and possible polypectomy were discussed with the patient and he consents to proceed.   2. Chronic Plavix. Patient has history of TIA and CAD / remote stents. We will contact his PCP who prescribes Plavix to make sure medication can be held for colonoscopy.  3. CAD, remote stents  4. History of TIA  5. Vertigo,  chronic.

## 2013-09-29 DIAGNOSIS — Z860100 Personal history of colon polyps, unspecified: Secondary | ICD-10-CM | POA: Insufficient documentation

## 2013-09-29 DIAGNOSIS — Z7901 Long term (current) use of anticoagulants: Secondary | ICD-10-CM | POA: Insufficient documentation

## 2013-09-29 NOTE — Telephone Encounter (Signed)
I called and advised Dr. Burnice Logan responded to me and advised he can hold the Plavix 5 days prior to 7- 22nd for his colonoscopy.  He can stop the Plavix on 10-06-13 and resume it on 10-12-2013.  Patient verbalized understanding the instructions. He also wrote them down.

## 2013-09-29 NOTE — Progress Notes (Signed)
Agree with Ms. Guenther's assessment and plan. Aeriel Boulay E. Owin Vignola, MD, FACG   

## 2013-10-09 ENCOUNTER — Telehealth: Payer: Self-pay | Admitting: Internal Medicine

## 2013-10-09 NOTE — Telephone Encounter (Signed)
The patient walked in to ask if he should take his Crestor and his blood pressure medication. I advised him he should take them the morning of the procedure prior to 7:30 am. The patient verbalized understanding.

## 2013-10-11 ENCOUNTER — Encounter: Payer: Self-pay | Admitting: Internal Medicine

## 2013-10-11 ENCOUNTER — Ambulatory Visit (AMBULATORY_SURGERY_CENTER): Payer: Commercial Managed Care - HMO | Admitting: Internal Medicine

## 2013-10-11 VITALS — BP 131/79 | HR 53 | Temp 97.3°F | Resp 33 | Ht 70.0 in | Wt 174.0 lb

## 2013-10-11 DIAGNOSIS — D126 Benign neoplasm of colon, unspecified: Secondary | ICD-10-CM

## 2013-10-11 DIAGNOSIS — Z860100 Personal history of colon polyps, unspecified: Secondary | ICD-10-CM

## 2013-10-11 MED ORDER — SODIUM CHLORIDE 0.9 % IV SOLN: 500.0000 mL | INTRAVENOUS | Status: DC

## 2013-10-11 NOTE — Patient Instructions (Addendum)
I found and removed 3 small polyps that look benign.  I will let you know pathology results and when to have another routine colonoscopy by mail. I appreciate the opportunity to care for you. Gatha Mayer, MD, FACG  YOU HAD AN ENDOSCOPIC PROCEDURE TODAY AT [facility]: Refer to the procedure report that was given to you for any specific questions about what was found during the examination.  If the procedure report does not answer your questions, please call your gastroenterologist to clarify.  If you requested that your care partner not be given the details of your procedure findings, then the procedure report has been included in a sealed envelope for you to review at your convenience later.  YOU SHOULD EXPECT: Some feelings of bloating in the abdomen. Passage of more gas than usual.  Walking can help get rid of the air that was put into your GI tract during the procedure and reduce the bloating. If you had a lower endoscopy (such as a colonoscopy or flexible sigmoidoscopy) you may notice spotting of blood in your stool or on the toilet paper. If you underwent a bowel prep for your procedure, then you may not have a normal bowel movement for a few days.  DIET: Your first meal following the procedure should be a light meal and then it is ok to progress to your normal diet.  A half-sandwich or bowl of soup is an example of a good first meal.  Heavy or fried foods are harder to digest and may make you feel nauseous or bloated.  Likewise meals heavy in dairy and vegetables can cause extra gas to form and this can also increase the bloating.  Drink plenty of fluids but you should avoid alcoholic beverages for 24 hours.  ACTIVITY: Your care partner should take you home directly after the procedure.  You should plan to take it easy, moving slowly for the rest of the day.  You can resume normal activity the day after the procedure however you should NOT DRIVE or use heavy machinery for 24  hours (because of the sedation medicines used during the test).    SYMPTOMS TO REPORT IMMEDIATELY: A gastroenterologist can be reached at any hour.  During normal business hours, 8:30 AM to 5:00 PM Monday through Friday, call [phone].  After hours and on weekends, please call the GI answering service at [phone] who will take a message and have the physician on call contact you.   Following lower endoscopy (colonoscopy or flexible sigmoidoscopy):  Excessive amounts of blood in the stool  Significant tenderness or worsening of abdominal pains  Swelling of the abdomen that is new, acute  Fever of 100F or higher    FOLLOW UP: If any biopsies were taken you will be contacted by phone or by letter within the next 1-3 weeks.  Call your gastroenterologist if you have not heard about the biopsies in 3 weeks.  Our staff will call the home number listed on your records the next business day following your procedure to check on you and address any questions or concerns that you may have at that time regarding the information given to you following your procedure. This is a courtesy call and so if there is no answer at the home number and we have not heard from you through the emergency physician on call, we will assume that you have returned to your regular daily activities without incident.  SIGNATURES/CONFIDENTIALITY: You and/or your care partner  have signed paperwork which will be entered into your electronic medical record.  These signatures attest to the fact that that the information above on your After Visit Summary has been reviewed and is understood.  Full responsibility of the confidentiality of this discharge information lies with you and/or your care-partner.  Polyp information given.

## 2013-10-11 NOTE — Op Note (Signed)
[place]  [address] [place], [address]   COLONOSCOPY PROCEDURE REPORT  PATIENT: Marlin, Jarrard  MR#: [id number] BIRTHDATE: Jun 18, 1945 , 68  yrs. old GENDER: Male ENDOSCOPIST: Gatha Mayer, MD, [facility] PROCEDURE DATE:  10/11/2013 PROCEDURE:   Colonoscopy with biopsy and snare polypectomy First Screening Colonoscopy - Avg.  risk and is 50 yrs.  old or older - No.  Prior Negative Screening - Now for repeat screening. N/A  History of Adenoma - Now for follow-up colonoscopy & has been > or = to 3 yrs.  Yes hx of adenoma.  Has been 3 or more years since last colonoscopy.  Polyps Removed Today? Yes. ASA CLASS:   Class III INDICATIONS:Patient's personal history of adenomatous colon polyps.  MEDICATIONS: propofol (Diprivan) 250mg  IV, MAC sedation, administered by CRNA, and These medications were titrated to patient response per physician's verbal order  DESCRIPTION OF PROCEDURE:   After the risks benefits and alternatives of the procedure were thoroughly explained, informed consent was obtained.  A digital rectal exam revealed no abnormalities of the rectum, A digital rectal exam revealed no prostatic nodules, and A digital rectal exam revealed the prostate was not enlarged.   The LB [id number] and [id number] endoscope was introduced through the anus and advanced to the cecum, which was identified by both the appendix and ileocecal valve. No adverse events experienced.   The quality of the prep was excellent using Suprep  The instrument was then slowly withdrawn as the colon was fully examined.  COLON FINDINGS: Three sessile polyps measuring 8, 2 and 3 mm in size were found in the ascending colon and descending colon.  A polypectomy was performed with a cold snare and with cold forceps. The resection was complete and the polyp tissue was completely retrieved.   The colon mucosa was otherwise normal.   The mucosa appeared normal in the terminal  ileum.   A right colon retroflexion was performed.  Retroflexed views revealed no abnormalities. The time to cecum=1 minutes 33 seconds.  Withdrawal time=11 minutes 20 seconds.  The scope was withdrawn and the procedure completed. COMPLICATIONS: There were no complications.  ENDOSCOPIC IMPRESSION: 1.   Three sessile polyps measuring 8, 2 and 3 mm in size were found in the ascending colon and descending colon; polypectomy was performed with a cold snare and with cold forceps 2.   The colon mucosa was otherwise normal - excellent prep - remote hx adenomas 3.   Normal mucosa in the terminal ileum  RECOMMENDATIONS: 1.  Timing of repeat colonoscopy will be determined by pathology findings. 2.   resume clopidogrel today  eSigned:  Gatha Mayer, MD, [facility] 10/11/2013 11:16 AM cc: Marletta Lor, MD and The Patient

## 2013-10-11 NOTE — Progress Notes (Signed)
Patient awakening,vss,report to rn 

## 2013-10-11 NOTE — Progress Notes (Signed)
Called to room to assist during endoscopic procedure.  Patient ID and intended procedure confirmed with present staff. Received instructions for my participation in the procedure from the performing physician.  

## 2013-10-12 ENCOUNTER — Telehealth: Payer: Self-pay | Admitting: *Deleted

## 2013-10-12 NOTE — Telephone Encounter (Signed)
  Follow up Call-  Call back number 10/11/2013  Post procedure Call Back phone  # [phone]  Permission to leave phone message Yes     Patient questions:  Do you have a fever, pain , or abdominal swelling? No. Pain Score  0 *  Have you tolerated food without any problems? Yes.    Have you been able to return to your normal activities? Yes.    Do you have any questions about your discharge instructions: Diet   No. Medications  No. Follow up visit  No.  Do you have questions or concerns about your Care? Yes.  Sore rectum.  Actions: * If pain score is 4 or above:0 No action needed, pain <4.

## 2013-10-18 ENCOUNTER — Encounter: Payer: Self-pay | Admitting: Internal Medicine

## 2013-10-18 NOTE — Progress Notes (Signed)
Quick Note:  3 adenomas max 8 mm - repeat colon 2018 ______

## 2013-11-28 ENCOUNTER — Ambulatory Visit (INDEPENDENT_AMBULATORY_CARE_PROVIDER_SITE_OTHER): Payer: Commercial Managed Care - HMO

## 2013-11-28 DIAGNOSIS — Z23 Encounter for immunization: Secondary | ICD-10-CM

## 2014-02-05 ENCOUNTER — Ambulatory Visit (INDEPENDENT_AMBULATORY_CARE_PROVIDER_SITE_OTHER): Payer: Commercial Managed Care - HMO | Admitting: Family Medicine

## 2014-02-05 ENCOUNTER — Encounter: Payer: Self-pay | Admitting: Family Medicine

## 2014-02-05 VITALS — BP 120/80 | HR 88 | Temp 98.0°F | Wt 175.0 lb

## 2014-02-05 DIAGNOSIS — R05 Cough: Secondary | ICD-10-CM

## 2014-02-05 DIAGNOSIS — M545 Low back pain, unspecified: Secondary | ICD-10-CM

## 2014-02-05 DIAGNOSIS — R059 Cough, unspecified: Secondary | ICD-10-CM

## 2014-02-05 MED ORDER — BENZONATATE 200 MG PO CAPS: 200.0000 mg | ORAL_CAPSULE | Freq: Two times a day (BID) | ORAL | Status: DC | PRN

## 2014-02-05 MED ORDER — TRAMADOL HCL 50 MG PO TABS: 50.0000 mg | ORAL_TABLET | Freq: Four times a day (QID) | ORAL | Status: DC | PRN

## 2014-02-05 NOTE — Patient Instructions (Signed)
Cough, Adult  A cough is a reflex that helps clear your throat and airways. It can help heal the body or may be a reaction to an irritated airway. A cough may only last 2 or 3 weeks (acute) or may last more than 8 weeks (chronic).  CAUSES Acute cough:  Viral or bacterial infections. Chronic cough:  Infections.  Allergies.  Asthma.  Post-nasal drip.  Smoking.  Heartburn or acid reflux.  Some medicines.  Chronic lung problems (COPD).  Cancer. SYMPTOMS   Cough.  Fever.  Chest pain.  Increased breathing rate.  High-pitched whistling sound when breathing (wheezing).  Colored mucus that you cough up (sputum). TREATMENT   A bacterial cough may be treated with antibiotic medicine.  A viral cough must run its course and will not respond to antibiotics.  Your caregiver may recommend other treatments if you have a chronic cough. HOME CARE INSTRUCTIONS   Only take over-the-counter or prescription medicines for pain, discomfort, or fever as directed by your caregiver. Use cough suppressants only as directed by your caregiver.  Use a cold steam vaporizer or humidifier in your bedroom or home to help loosen secretions.  Sleep in a semi-upright position if your cough is worse at night.  Rest as needed.  Stop smoking if you smoke. SEEK IMMEDIATE MEDICAL CARE IF:   You have pus in your sputum.  Your cough starts to worsen.  You cannot control your cough with suppressants and are losing sleep.  You begin coughing up blood.  You have difficulty breathing.  You develop pain which is getting worse or is uncontrolled with medicine.  You have a fever. MAKE SURE YOU:   Understand these instructions.  Will watch your condition.  Will get help right away if you are not doing well or get worse. Document Released: 09/05/2010 Document Revised: 06/01/2011 Document Reviewed: 09/05/2010 ExitCare Patient Information 2015 [employer]. This information is not intended  to replace advice given to you by your health care provider. Make sure you discuss any questions you have with your health care provider.  

## 2014-02-05 NOTE — Progress Notes (Signed)
Pre visit review using our clinic review tool, if applicable. No additional management support is needed unless otherwise documented below in the visit note. 

## 2014-02-05 NOTE — Progress Notes (Signed)
   Subjective:    Patient ID: Ian Roman, male    DOB: Jun 18, 1945, 68 y.o.   MRN: [id number]  HPI Patient seen with cough and low back pain. He's had at least 4 weeks of cough which is most nonproductive. He is nonsmoker. His cough is both day and night. Again this mostly dry. No sick and postnasal drip symptoms. No GERD symptoms. Tried Claritin, Delsym, and Mucinex without much relief. No appetite or weight changes. No hemoptysis.  He does not take any ACE inhibitor. No history of asthma or known wheezing. No chronic sinusitis symptoms. He also complains of some left lower back pain for about 2 weeks. No specific injury. No dysuria. Issues Tylenol and heat with minimal relief. No radiculopathy symptoms.  Past Medical History  Diagnosis Date  . Hyperlipidemia   . Hypertension   . Stroke     tia   . Prostatitis   . Coronary artery disease     Status post stenting x2 2007  . Vertigo   . Personal history of colonic polyps    Past Surgical History  Procedure Laterality Date  . Coronary angioplasty with stent placement      x2   2007  . Colonoscopy      reports that he has never smoked. He has never used smokeless tobacco. He reports that he drinks alcohol. He reports that he does not use illicit drugs. family history includes Cancer in his father; Heart disease in his mother. Allergies  Allergen Reactions  . Zocor [Simvastatin]     Intolerance      Review of Systems  Constitutional: Negative for fever and chills.  HENT: Negative for postnasal drip.   Respiratory: Positive for cough. Negative for shortness of breath and wheezing.   Cardiovascular: Negative for chest pain, palpitations and leg swelling.  Gastrointestinal: Negative for abdominal pain.  Musculoskeletal: Positive for back pain.       Objective:   Physical Exam  Constitutional: He appears well-developed and well-nourished.  HENT:  Right Ear: External ear normal.  Left Ear: External ear normal.    Mouth/Throat: Oropharynx is clear and moist.  Neck: Neck supple.  Cardiovascular: Normal rate and regular rhythm.   Pulmonary/Chest: Effort normal and breath sounds normal. No respiratory distress. He has no wheezes. He has no rales.  Musculoskeletal: He exhibits no edema.  Straight leg raise is negative on the left  Lymphadenopathy:    He has no cervical adenopathy.  Neurological: He has normal reflexes.  Full-strength lower extremities          Assessment & Plan:  #1 cough. Suspect acute viral bronchitis.  Wrote for [employer] 2 mg every 8 hours for cough. Also prescribing tramadol for his back pain and we explained this could help as a suppressant. Consider chest x-ray or further evaluation of cough not resolving over the next couple weeks #2 left lumbar back pain. Nonfocal exam. Suspect musculoskeletal. Trial of tramadol as above. Follow-up with primary if no better in 2 weeks

## 2014-05-01 ENCOUNTER — Other Ambulatory Visit: Payer: Self-pay | Admitting: Internal Medicine

## 2014-05-01 ENCOUNTER — Telehealth: Payer: Self-pay | Admitting: Internal Medicine

## 2014-05-01 DIAGNOSIS — I10 Essential (primary) hypertension: Secondary | ICD-10-CM

## 2014-05-01 MED ORDER — CLOPIDOGREL BISULFATE 75 MG PO TABS: ORAL_TABLET | ORAL | Status: DC

## 2014-05-01 MED ORDER — METOPROLOL SUCCINATE ER 25 MG PO TB24: 25.0000 mg | ORAL_TABLET | Freq: Every day | ORAL | Status: DC

## 2014-05-01 NOTE — Telephone Encounter (Signed)
Pt request refill of the following:    metoprolol succinate (TOPROL-XL) 25 MG 24 hr tablet, clopidogrel (PLAVIX) 75 MG tablet  Pt said he is out of the above meds    Phamacy: [facility]

## 2014-05-01 NOTE — Telephone Encounter (Signed)
Pt notified Rx's sent to pharmacy. 

## 2014-05-15 ENCOUNTER — Other Ambulatory Visit (INDEPENDENT_AMBULATORY_CARE_PROVIDER_SITE_OTHER): Payer: Commercial Managed Care - HMO

## 2014-05-15 DIAGNOSIS — Z0000 Encounter for general adult medical examination without abnormal findings: Secondary | ICD-10-CM

## 2014-05-15 LAB — BASIC METABOLIC PANEL
BUN: 16 mg/dL (ref 6–23)
CALCIUM: 9.2 mg/dL (ref 8.4–10.5)
CO2: 31 mEq/L (ref 19–32)
CREATININE: 1.14 mg/dL (ref 0.40–1.50)
Chloride: 102 mEq/L (ref 96–112)
GFR: 81.97 mL/min (ref >60.00)
Glucose, Bld: 94 mg/dL (ref 70–99)
Potassium: 4.3 mEq/L (ref 3.5–5.1)
Sodium: 137 mEq/L (ref 135–145)

## 2014-05-15 LAB — CBC WITH DIFFERENTIAL/PLATELET
Basophils Absolute: 0 10*3/uL (ref 0.0–0.1)
Basophils Relative: 0.4 % (ref 0.0–3.0)
Eosinophils Absolute: 0.1 10*3/uL (ref 0.0–0.7)
Eosinophils Relative: 2.1 % (ref 0.0–5.0)
HCT: 40.8 % (ref 39.0–52.0)
Hemoglobin: 13.9 g/dL (ref 13.0–17.0)
Lymphocytes Relative: 20.2 % (ref 12.0–46.0)
Lymphs Abs: 1.3 10*3/uL (ref 0.7–4.0)
MCHC: 34.2 g/dL (ref 30.0–36.0)
MCV: 97.9 fl (ref 78.0–100.0)
MONO ABS: 0.5 10*3/uL (ref 0.1–1.0)
Monocytes Relative: 7.7 % (ref 3.0–12.0)
NEUTROS PCT: 69.6 % (ref 43.0–77.0)
Neutro Abs: 4.5 10*3/uL (ref 1.4–7.7)
PLATELETS: 187 10*3/uL (ref 150.0–400.0)
RBC: 4.16 Mil/uL — ABNORMAL LOW (ref 4.22–5.81)
RDW: 12.1 % (ref 11.5–15.5)
WBC: 6.5 10*3/uL (ref 4.0–10.5)

## 2014-05-15 LAB — HEPATIC FUNCTION PANEL
ALBUMIN: 4.5 g/dL (ref 3.5–5.2)
ALT: 20 U/L (ref 0–53)
AST: 23 U/L (ref 0–37)
Alkaline Phosphatase: 62 U/L (ref 39–117)
Bilirubin, Direct: 0.3 mg/dL (ref 0.0–0.3)
Total Bilirubin: 1.2 mg/dL (ref 0.2–1.2)
Total Protein: 7.2 g/dL (ref 6.0–8.3)

## 2014-05-15 LAB — LIPID PANEL
CHOL/HDL RATIO: 3
Cholesterol: 145 mg/dL (ref 0–200)
HDL: 44.2 mg/dL (ref >39.00)
LDL CALC: 83 mg/dL (ref 0–99)
NONHDL: 100.8
Triglycerides: 90 mg/dL (ref 0.0–149.0)
VLDL: 18 mg/dL (ref 0.0–40.0)

## 2014-05-15 LAB — POCT URINALYSIS DIP (MANUAL ENTRY)
Bilirubin, UA: NEGATIVE
Glucose, UA: NEGATIVE
Ketones, POC UA: NEGATIVE
LEUKOCYTES UA: NEGATIVE
Nitrite, UA: NEGATIVE
PH UA: 7
PROTEIN UA: NEGATIVE
Spec Grav, UA: 1.015
UROBILINOGEN UA: 0.2

## 2014-05-15 LAB — TSH: TSH: 0.94 u[IU]/mL (ref 0.35–4.50)

## 2014-05-15 LAB — PSA: PSA: 3.19 ng/mL (ref 0.10–4.00)

## 2014-05-17 ENCOUNTER — Other Ambulatory Visit: Payer: Self-pay | Admitting: Internal Medicine

## 2014-05-22 ENCOUNTER — Ambulatory Visit (INDEPENDENT_AMBULATORY_CARE_PROVIDER_SITE_OTHER): Payer: Commercial Managed Care - HMO | Admitting: Internal Medicine

## 2014-05-22 ENCOUNTER — Encounter: Payer: Self-pay | Admitting: Internal Medicine

## 2014-05-22 VITALS — BP 130/80 | HR 66 | Temp 98.1°F | Resp 20 | Ht 69.5 in | Wt 176.0 lb

## 2014-05-22 DIAGNOSIS — I2510 Atherosclerotic heart disease of native coronary artery without angina pectoris: Secondary | ICD-10-CM

## 2014-05-22 DIAGNOSIS — I10 Essential (primary) hypertension: Secondary | ICD-10-CM

## 2014-05-22 DIAGNOSIS — E785 Hyperlipidemia, unspecified: Secondary | ICD-10-CM

## 2014-05-22 DIAGNOSIS — Z0000 Encounter for general adult medical examination without abnormal findings: Secondary | ICD-10-CM

## 2014-05-22 DIAGNOSIS — Z7901 Long term (current) use of anticoagulants: Secondary | ICD-10-CM

## 2014-05-22 MED ORDER — TRAMADOL HCL 50 MG PO TABS: 50.0000 mg | ORAL_TABLET | Freq: Four times a day (QID) | ORAL | Status: DC | PRN

## 2014-05-22 MED ORDER — SILDENAFIL CITRATE 100 MG PO TABS: ORAL_TABLET | ORAL | Status: DC

## 2014-05-22 MED ORDER — BENZONATATE 200 MG PO CAPS: 200.0000 mg | ORAL_CAPSULE | Freq: Two times a day (BID) | ORAL | Status: DC | PRN

## 2014-05-22 MED ORDER — FLUTICASONE PROPIONATE 50 MCG/ACT NA SUSP: 2.0000 | Freq: Every day | NASAL | Status: AC

## 2014-05-22 NOTE — Patient Instructions (Addendum)
Limit your sodium (Salt) intake    It is important that you exercise regularly, at least 20 minutes 3 to 4 times per week.  If you develop chest pain or shortness of breath seek  medical attention.  Please check your blood pressure on a regular basis.  If it is consistently greater than 150/90, please make an office appointment.  Return in one year for follow-up   Health Maintenance A healthy lifestyle and preventative care can promote health and wellness.  Maintain regular health, dental, and eye exams.  Eat a healthy diet. Foods like vegetables, fruits, whole grains, low-fat dairy products, and lean protein foods contain the nutrients you need and are low in calories. Decrease your intake of foods high in solid fats, added sugars, and salt. Get information about a proper diet from your health care provider, if necessary.  Regular physical exercise is one of the most important things you can do for your health. Most adults should get at least 150 minutes of moderate-intensity exercise (any activity that increases your heart rate and causes you to sweat) each week. In addition, most adults need muscle-strengthening exercises on 2 or more days a week.   Maintain a healthy weight. The body mass index (BMI) is a screening tool to identify possible weight problems. It provides an estimate of body fat based on height and weight. Your health care provider can find your BMI and can help you achieve or maintain a healthy weight. For males 20 years and older:  A BMI below 18.5 is considered underweight.  A BMI of 18.5 to 24.9 is normal.  A BMI of 25 to 29.9 is considered overweight.  A BMI of 30 and above is considered obese.  Maintain normal blood lipids and cholesterol by exercising and minimizing your intake of saturated fat. Eat a balanced diet with plenty of fruits and vegetables. Blood tests for lipids and cholesterol should begin at age 53 and be repeated every 5 years. If your lipid or  cholesterol levels are high, you are over age 78, or you are at high risk for heart disease, you may need your cholesterol levels checked more frequently.Ongoing high lipid and cholesterol levels should be treated with medicines if diet and exercise are not working.  If you smoke, find out from your health care provider how to quit. If you do not use tobacco, do not start.  Lung cancer screening is recommended for adults aged 50-80 years who are at high risk for developing lung cancer because of a history of smoking. A yearly low-dose CT scan of the lungs is recommended for people who have at least a 30-pack-year history of smoking and are current smokers or have quit within the past 15 years. A pack year of smoking is smoking an average of 1 pack of cigarettes a day for 1 year (for example, a 30-pack-year history of smoking could mean smoking 1 pack a day for 30 years or 2 packs a day for 15 years). Yearly screening should continue until the smoker has stopped smoking for at least 15 years. Yearly screening should be stopped for people who develop a health problem that would prevent them from having lung cancer treatment.  If you choose to drink alcohol, do not have more than 2 drinks per day. One drink is considered to be 12 oz (360 mL) of beer, 5 oz (150 mL) of wine, or 1.5 oz (45 mL) of liquor.  Avoid the use of street drugs. Do not share  needles with anyone. Ask for help if you need support or instructions about stopping the use of drugs.  High blood pressure causes heart disease and increases the risk of stroke. Blood pressure should be checked at least every 1-2 years. Ongoing high blood pressure should be treated with medicines if weight loss and exercise are not effective.  If you are 54-59 years old, ask your health care provider if you should take aspirin to prevent heart disease.  Diabetes screening involves taking a blood sample to check your fasting blood sugar level. This should be done  once every 3 years after age 17 if you are at a normal weight and without risk factors for diabetes. Testing should be considered at a younger age or be carried out more frequently if you are overweight and have at least 1 risk factor for diabetes.  Colorectal cancer can be detected and often prevented. Most routine colorectal cancer screening begins at the age of 48 and continues through age 78. However, your health care provider may recommend screening at an earlier age if you have risk factors for colon cancer. On a yearly basis, your health care provider may provide home test kits to check for hidden blood in the stool. A small camera at the end of a tube may be used to directly examine the colon (sigmoidoscopy or colonoscopy) to detect the earliest forms of colorectal cancer. Talk to your health care provider about this at age 85 when routine screening begins. A direct exam of the colon should be repeated every 5-10 years through age 22, unless early forms of precancerous polyps or small growths are found.  People who are at an increased risk for hepatitis B should be screened for this virus. You are considered at high risk for hepatitis B if:  You were born in a country where hepatitis B occurs often. Talk with your health care provider about which countries are considered high risk.  Your parents were born in a high-risk country and you have not received a shot to protect against hepatitis B (hepatitis B vaccine).  You have HIV or AIDS.  You use needles to inject street drugs.  You live with, or have sex with, someone who has hepatitis B.  You are a man who has sex with other men (MSM).  You get hemodialysis treatment.  You take certain medicines for conditions like cancer, organ transplantation, and autoimmune conditions.  Hepatitis C blood testing is recommended for all people born from [birth year] through 1965 and any individual with known risk factors for hepatitis C.  Healthy men should  no longer receive prostate-specific antigen (PSA) blood tests as part of routine cancer screening. Talk to your health care provider about prostate cancer screening.  Testicular cancer screening is not recommended for adolescents or adult males who have no symptoms. Screening includes self-exam, a health care provider exam, and other screening tests. Consult with your health care provider about any symptoms you have or any concerns you have about testicular cancer.  Practice safe sex. Use condoms and avoid high-risk sexual practices to reduce the spread of sexually transmitted infections (STIs).  You should be screened for STIs, including gonorrhea and chlamydia if:  You are sexually active and are younger than 24 years.  You are older than 24 years, and your health care provider tells you that you are at risk for this type of infection.  Your sexual activity has changed since you were last screened, and you are at an increased  risk for chlamydia or gonorrhea. Ask your health care provider if you are at risk.  If you are at risk of being infected with HIV, it is recommended that you take a prescription medicine daily to prevent HIV infection. This is called pre-exposure prophylaxis (PrEP). You are considered at risk if:  You are a man who has sex with other men (MSM).  You are a heterosexual man who is sexually active with multiple partners.  You take drugs by injection.  You are sexually active with a partner who has HIV.  Talk with your health care provider about whether you are at high risk of being infected with HIV. If you choose to begin PrEP, you should first be tested for HIV. You should then be tested every 3 months for as long as you are taking PrEP.  Use sunscreen. Apply sunscreen liberally and repeatedly throughout the day. You should seek shade when your shadow is shorter than you. Protect yourself by wearing long sleeves, pants, a wide-brimmed hat, and sunglasses year round  whenever you are outdoors.  Tell your health care provider of new moles or changes in moles, especially if there is a change in shape or color. Also, tell your health care provider if a mole is larger than the size of a pencil eraser.  A one-time screening for abdominal aortic aneurysm (AAA) and surgical repair of large AAAs by ultrasound is recommended for men aged 34-75 years who are current or former smokers.  Stay current with your vaccines (immunizations). Document Released: 09/05/2007 Document Revised: 03/14/2013 Document Reviewed: 08/04/2010 [facility] Patient Information 2015 [place], [place]. This information is not intended to replace advice given to you by your health care provider. Make sure you discuss any questions you have with your health care provider.

## 2014-05-22 NOTE — Progress Notes (Signed)
Patient ID: [name], male   DOB: 1945-11-11, 69 y.o.   MRN: [id number]  Subjective:    Patient ID: Ian Roman, male    DOB: [birth year], 69 y.o.   MRN: [id number]  Coronary Artery Disease Pertinent negatives include no chest pain, chest tightness, dizziness, leg swelling, palpitations or shortness of breath.   57 -year-old patient who is seen today for an annual examination.  He states that he was evaluated in 2002 for a TIA. He has been on Plavix since that time. In 2007 he is status post coronary angiography with stenting of 2 vessels. He remains quite active and denies any cardiopulmonary symptoms. He has treated hypertension and dyslipidemia. He retired in June 2012 and has self discontinued hydrochlorothiazide.  He remains on metoprolol He had follow-up colonoscopy in July 2015 and had recurrent polyps.  He is scheduled for follow-up in 2018    1. Risk factors, based on past  M,S,F history. Patient has known coronary artery disease. Cardiovascular as factors include hypertension dyslipidemia  2.  Physical activities: Remains quite active without exercise restrictions;  exercises on a regular basis  3.  Depression/mood: No history depression or mood disorder 4.  Hearing: No deficits  5.  ADL's: Independent in all aspects of daily living  6.  Fall risk: Low  7.  Home safety: No problems identified  8.  Height weight, and visual acuity; height and weight stable no change in visual acuity  9.  Counseling: Heart healthy diet encouraged regular exercise recommended home blood pressure monitoring strongly encouraged  10. Lab orders based on risk factors: Will check lipid profile and general screening lab in the spring  11. Referral: Not appropriate at this time  12. Care plan:  Follow up colonoscopy in 2018  13. Cognitive assessment: Alert and oriented with normal affect.  No cognitive dysfunction  14.  Preventive services will include annual clinical examination is  with screening lab.  Annual examinations recommended.  He will have periodic colonoscopies due to his history of colonic polyps.  Patient was provided with a written and personalized care plan  15.  Provider list includes primary care GI ophthalmology.     Past medical history:  Coronary artery disease status post stenting 2007 History of TIA 2002 Positional vertigo Hypertension Dyslipidemia Obstructive sleep apnea ED History pneumonia 2010 Chronic prostatitis Colonic polyps status post polypectomy 2002 colonoscopy 2009  July 2015  Family history father died age 53  prostate cancer Mother died age 42 congestive heart failure 3 older sisters one died of a CNS neoplasm  Social history retired June 2012 25 year resident of [place] nonsmoker Married. One stepdaughter one grandchild    Review of Systems  Constitutional: Negative for fever, chills, activity change, appetite change and fatigue.  HENT: Negative for congestion, dental problem, ear pain, hearing loss, mouth sores, rhinorrhea, sinus pressure, sneezing, tinnitus, trouble swallowing and voice change.        Chronic positional vertigo with lying on his right side only  Eyes: Negative for photophobia, pain, redness and visual disturbance.  Respiratory: Negative for apnea, cough, choking, chest tightness, shortness of breath and wheezing.   Cardiovascular: Negative for chest pain, palpitations and leg swelling.  Gastrointestinal: Negative for nausea, vomiting, abdominal pain, diarrhea, constipation, blood in stool, abdominal distention, anal bleeding and rectal pain.  Genitourinary: Negative for dysuria, urgency, frequency, hematuria, flank pain, decreased urine volume, discharge, penile swelling, scrotal swelling, difficulty urinating, genital sores and testicular pain.  Musculoskeletal: Negative for  myalgias, back pain, joint swelling, arthralgias, gait problem, neck pain and neck stiffness.  Skin: Negative for color  change, rash and wound.  Neurological: Negative for dizziness, tremors, seizures, syncope, facial asymmetry, speech difficulty, weakness, light-headedness, numbness and headaches.  Hematological: Negative for adenopathy. Does not bruise/bleed easily.  Psychiatric/Behavioral: Negative for suicidal ideas, hallucinations, behavioral problems, confusion, sleep disturbance, self-injury, dysphoric mood, decreased concentration and agitation. The patient is not nervous/anxious.        Objective:   Physical Exam  Constitutional: He appears well-developed and well-nourished.  HENT:  Head: Normocephalic and atraumatic.  Right Ear: External ear normal.  Left Ear: External ear normal.  Nose: Nose normal.  Mouth/Throat: Oropharynx is clear and moist.  Eyes: Conjunctivae and EOM are normal. Pupils are equal, round, and reactive to light. No scleral icterus.  Neck: Normal range of motion. Neck supple. No JVD present. No thyromegaly present.  Cardiovascular: Regular rhythm, normal heart sounds and intact distal pulses.  Exam reveals no gallop and no friction rub.   No murmur heard. Pulmonary/Chest: Effort normal and breath sounds normal. He exhibits no tenderness.  Abdominal: Soft. Bowel sounds are normal. He exhibits no distension and no mass. There is no tenderness.  Genitourinary: Prostate normal and penis normal.  Musculoskeletal: Normal range of motion. He exhibits no edema or tenderness.  Lymphadenopathy:    He has no cervical adenopathy.  Neurological: He is alert. He has normal reflexes. No cranial nerve deficit. Coordination normal.  Skin: Skin is warm and dry. No rash noted.  Psychiatric: He has a normal mood and affect. His behavior is normal.          Assessment & Plan:   Preventive health examination Coronary artery disease Hypertension. His blood pressure is well controlled today; we'll continue close home blood  pressure monitoring low-salt diet Obstructive sleep  apnea  Followup colonoscopy 2018

## 2014-05-22 NOTE — Progress Notes (Signed)
Pre visit review using our clinic review tool, if applicable. No additional management support is needed unless otherwise documented below in the visit note. 

## 2014-06-04 ENCOUNTER — Encounter: Payer: Self-pay | Admitting: Internal Medicine

## 2014-06-04 ENCOUNTER — Ambulatory Visit (INDEPENDENT_AMBULATORY_CARE_PROVIDER_SITE_OTHER): Payer: Commercial Managed Care - HMO | Admitting: Internal Medicine

## 2014-06-04 ENCOUNTER — Other Ambulatory Visit: Payer: Self-pay | Admitting: *Deleted

## 2014-06-04 ENCOUNTER — Ambulatory Visit (INDEPENDENT_AMBULATORY_CARE_PROVIDER_SITE_OTHER)
Admission: RE | Admit: 2014-06-04 | Discharge: 2014-06-04 | Disposition: A | Discharge status: Home / Self Care | Payer: Commercial Managed Care - HMO | Source: Ambulatory Visit | Attending: Internal Medicine | Admitting: Internal Medicine

## 2014-06-04 VITALS — BP 120/80 | HR 60 | Temp 98.1°F | Resp 22 | Ht 69.5 in | Wt 174.0 lb

## 2014-06-04 DIAGNOSIS — B9789 Other viral agents as the cause of diseases classified elsewhere: Secondary | ICD-10-CM

## 2014-06-04 DIAGNOSIS — J069 Acute upper respiratory infection, unspecified: Secondary | ICD-10-CM

## 2014-06-04 DIAGNOSIS — I2510 Atherosclerotic heart disease of native coronary artery without angina pectoris: Secondary | ICD-10-CM

## 2014-06-04 MED ORDER — AZITHROMYCIN 250 MG PO TABS: ORAL_TABLET | ORAL | Status: DC

## 2014-06-04 MED ORDER — HYDROCODONE-HOMATROPINE 5-1.5 MG/5ML PO SYRP: 5.0000 mL | ORAL_SOLUTION | Freq: Four times a day (QID) | ORAL | Status: DC | PRN

## 2014-06-04 NOTE — Progress Notes (Signed)
Pre visit review using our clinic review tool, if applicable. No additional management support is needed unless otherwise documented below in the visit note. 

## 2014-06-04 NOTE — Progress Notes (Signed)
Subjective:    Patient ID: Ian Roman, male    DOB: [birth year], 69 y.o.   MRN: [id number]  HPI  70 year old patient who presents with a chief complaint of cough up 4-5 days duration.  Cough is largely nonproductive.  He is in a difficult time especially at night, landing flat.  At times he has slept in a recliner.  He does note occasional wheezing.  He has been using Mucinex, Sudafed, and Tessalon without much help.  Denies any fever  Past Medical History  Diagnosis Date  . Hyperlipidemia   . Hypertension   . Stroke     tia   . Prostatitis   . Coronary artery disease     Status post stenting x2 2007  . Vertigo   . Personal history of colonic polyps     History   Social History  . Marital Status: Married    Spouse Name: N/A  . Number of Children: N/A  . Years of Education: N/A   Occupational History  . Not on file.   Social History Main Topics  . Smoking status: Never Smoker   . Smokeless tobacco: Never Used  . Alcohol Use: Yes  . Drug Use: No  . Sexual Activity: Not on file   Other Topics Concern  . Not on file   Social History Narrative    Past Surgical History  Procedure Laterality Date  . Coronary angioplasty with stent placement      x2   2007  . Colonoscopy      Family History  Problem Relation Age of Onset  . Heart disease Mother   . Cancer Father     prostate ca    Allergies  Allergen Reactions  . Zocor [Simvastatin]     Intolerance    Current Outpatient Prescriptions on File Prior to Visit  Medication Sig Dispense Refill  . aspirin 81 MG tablet Take 81 mg by mouth daily.    . clopidogrel (PLAVIX) 75 MG tablet TAKE ONE TABLET BY MOUTH EVERY DAY 90 tablet 1  . co-enzyme Q-10 30 MG capsule Take 30 mg by mouth daily.    . CRESTOR 5 MG tablet TAKE ONE TABLET BY MOUTH ONCE DAILY 90 tablet 1  . Flaxseed, Linseed, (FLAXSEED OIL) 1000 MG CAPS Take by mouth.    . fluticasone (FLONASE) 50 MCG/ACT nasal spray Place 2 sprays into both  nostrils daily. 16 g 5  . Lycopene 10 MG CAPS Take 1 capsule by mouth daily.    . metoprolol succinate (TOPROL-XL) 25 MG 24 hr tablet Take 1 tablet (25 mg total) by mouth daily. 90 tablet 1  . Multiple Vitamin tablet Take by mouth.    . NON FORMULARY CPAP machine     . sildenafil (VIAGRA) 100 MG tablet TAKE ONE TABLET BY MOUTH AS NEEDED FOR  ERECTILE  DYSFUNCTION 20 tablet 2  . traMADol (ULTRAM) 50 MG tablet Take 1 tablet (50 mg total) by mouth every 6 (six) hours as needed. 60 tablet 1  . benzonatate (TESSALON) 200 MG capsule Take 1 capsule (200 mg total) by mouth 2 (two) times daily as needed for cough. (Patient not taking: Reported on 06/04/2014) 20 capsule 0   No current facility-administered medications on file prior to visit.    BP 120/80 mmHg  Pulse 60  Temp(Src) 98.1 F (36.7 C) (Oral)  Resp 22  Ht 5' 9.5" (1.765 m)  Wt 174 lb (78.926 kg)  BMI 25.34 kg/m2  SpO2 96%  Review of Systems  Constitutional: Positive for activity change, appetite change and fatigue. Negative for fever and chills.  HENT: Positive for congestion. Negative for dental problem, ear pain, hearing loss, sore throat, tinnitus, trouble swallowing and voice change.   Eyes: Negative for pain, discharge and visual disturbance.  Respiratory: Positive for cough and wheezing. Negative for chest tightness and stridor.   Cardiovascular: Negative for chest pain, palpitations and leg swelling.  Gastrointestinal: Negative for nausea, vomiting, abdominal pain, diarrhea, constipation, blood in stool and abdominal distention.  Genitourinary: Negative for urgency, hematuria, flank pain, discharge, difficulty urinating and genital sores.  Musculoskeletal: Negative for myalgias, back pain, joint swelling, arthralgias, gait problem and neck stiffness.  Skin: Negative for rash.  Neurological: Negative for dizziness, syncope, speech difficulty, weakness, numbness and headaches.  Hematological: Negative for adenopathy. Does  not bruise/bleed easily.  Psychiatric/Behavioral: Negative for behavioral problems and dysphoric mood. The patient is not nervous/anxious.        Objective:   Physical Exam  Constitutional: He is oriented to person, place, and time. He appears well-developed.  HENT:  Head: Normocephalic.  Right Ear: External ear normal.  Left Ear: External ear normal.  Eyes: Conjunctivae and EOM are normal.  Neck: Normal range of motion.  Cardiovascular: Normal rate, regular rhythm and normal heart sounds.   Pulmonary/Chest: Effort normal.  A few crackles left base  Abdominal: Bowel sounds are normal.  Musculoskeletal: Normal range of motion. He exhibits no edema or tenderness.  Neurological: He is alert and oriented to person, place, and time.  Psychiatric: He has a normal mood and affect. His behavior is normal.          Assessment & Plan:   Viral URI with cough Stable coronary artery disease  We'll treat symptomatically.  Will check a chest x-ray to rule out unlikely left lower lobe pneumonia Patient instructions discussed and dispensed

## 2014-06-04 NOTE — Patient Instructions (Signed)
Acute bronchitis symptoms for less than 10 days are generally not helped by antibiotics.  Take over-the-counter expectorants and cough medications such as  Mucinex DM.  Call if there is no improvement in 5 to 7 days or if  you develop worsening cough, fever, or new symptoms, such as shortness of breath or chest pain.    Acute bronchitis usually goes away in a couple weeks. Oftentimes, no medical treatment is necessary. Medicines are sometimes given for relief of fever or cough. Antibiotic medicines are usually not needed but may be prescribed in certain situations. In some cases, an inhaler may be recommended to help reduce shortness of breath and control the cough. A cool mist vaporizer may also be used to help thin bronchial secretions and make it easier to clear the chest.   HOME CARE INSTRUCTIONS  Get plenty of rest.  Drink enough fluids to keep your urine clear or pale yellow (unless you have a medical condition that requires fluid restriction). Increasing fluids may help thin your respiratory secretions (sputum) and reduce chest congestion, and it will prevent dehydration.  Take medicines only as directed by your health care provider.   Avoid smoking and secondhand smoke. Exposure to cigarette smoke or irritating chemicals will make bronchitis worse. If you are a smoker, consider using nicotine gum or skin patches to help control withdrawal symptoms. Quitting smoking will help your lungs heal faster.  Reduce the chances of another bout of acute bronchitis by washing your hands frequently, avoiding people with cold symptoms, and trying not to touch your hands to your mouth, nose, or eyes.

## 2014-06-08 ENCOUNTER — Encounter: Payer: Self-pay | Admitting: Internal Medicine

## 2014-06-08 ENCOUNTER — Ambulatory Visit (INDEPENDENT_AMBULATORY_CARE_PROVIDER_SITE_OTHER): Payer: Commercial Managed Care - HMO | Admitting: Internal Medicine

## 2014-06-08 VITALS — BP 124/70 | HR 53 | Temp 98.5°F | Resp 20 | Ht 69.5 in | Wt 177.0 lb

## 2014-06-08 DIAGNOSIS — R0601 Orthopnea: Secondary | ICD-10-CM

## 2014-06-08 DIAGNOSIS — J069 Acute upper respiratory infection, unspecified: Secondary | ICD-10-CM

## 2014-06-08 DIAGNOSIS — B9789 Other viral agents as the cause of diseases classified elsewhere: Secondary | ICD-10-CM

## 2014-06-08 DIAGNOSIS — I10 Essential (primary) hypertension: Secondary | ICD-10-CM

## 2014-06-08 DIAGNOSIS — Z7901 Long term (current) use of anticoagulants: Secondary | ICD-10-CM

## 2014-06-08 DIAGNOSIS — E785 Hyperlipidemia, unspecified: Secondary | ICD-10-CM

## 2014-06-08 LAB — BRAIN NATRIURETIC PEPTIDE: Pro B Natriuretic peptide (BNP): 38 pg/mL (ref 0.0–100.0)

## 2014-06-08 MED ORDER — HYDROCODONE-HOMATROPINE 5-1.5 MG/5ML PO SYRP: 5.0000 mL | ORAL_SOLUTION | Freq: Four times a day (QID) | ORAL | Status: DC | PRN

## 2014-06-08 NOTE — Patient Instructions (Signed)
Take over-the-counter expectorants and cough medications such as  Mucinex DM.  Call if there is no improvement in 5 to 7 days or if  you develop worsening cough, fever, or new symptoms, such as shortness of breath or chest pain.  Cough medicines as directed  Call or return to clinic prn if these symptoms worsen or fail to improve as anticipated.

## 2014-06-08 NOTE — Progress Notes (Signed)
Pre visit review using our clinic review tool, if applicable. No additional management support is needed unless otherwise documented below in the visit note. 

## 2014-06-08 NOTE — Progress Notes (Signed)
Subjective:    Patient ID: Ian Roman, male    DOB: March 18, 1946, 69 y.o.   MRN: [id number]  HPI 69 year old patient who is seen today in follow-up.  He was seen recently for suspected viral URI.  A chest x-ray was obtained due to a suspicion of a left lower lobe pneumonia.  Radiographs suggested a possible right middle lobe infiltrate in the patient has been treated with azithromycin.  He generally feels well but has a chief complaint of nocturnal cough that is nonproductive.  Cough interfered with sleep last night.  He states he generally feels well throughout the day, except for some mild dyspnea.  He does have a history of coronary artery disease but no history of heart failure  Past Medical History  Diagnosis Date  . Hyperlipidemia   . Hypertension   . Stroke     tia   . Prostatitis   . Coronary artery disease     Status post stenting x2 2007  . Vertigo   . Personal history of colonic polyps     History   Social History  . Marital Status: Married    Spouse Name: N/A  . Number of Children: N/A  . Years of Education: N/A   Occupational History  . Not on file.   Social History Main Topics  . Smoking status: Never Smoker   . Smokeless tobacco: Never Used  . Alcohol Use: Yes  . Drug Use: No  . Sexual Activity: Not on file   Other Topics Concern  . Not on file   Social History Narrative    Past Surgical History  Procedure Laterality Date  . Coronary angioplasty with stent placement      x2   2007  . Colonoscopy      Family History  Problem Relation Age of Onset  . Heart disease Mother   . Cancer Father     prostate ca    Allergies  Allergen Reactions  . Zocor [Simvastatin]     Intolerance    Current Outpatient Prescriptions on File Prior to Visit  Medication Sig Dispense Refill  . aspirin 81 MG tablet Take 81 mg by mouth daily.    . benzonatate (TESSALON) 200 MG capsule Take 1 capsule (200 mg total) by mouth 2 (two) times daily as needed for  cough. 20 capsule 0  . clopidogrel (PLAVIX) 75 MG tablet TAKE ONE TABLET BY MOUTH EVERY DAY 90 tablet 1  . co-enzyme Q-10 30 MG capsule Take 30 mg by mouth daily.    . CRESTOR 5 MG tablet TAKE ONE TABLET BY MOUTH ONCE DAILY 90 tablet 1  . Flaxseed, Linseed, (FLAXSEED OIL) 1000 MG CAPS Take by mouth.    . fluticasone (FLONASE) 50 MCG/ACT nasal spray Place 2 sprays into both nostrils daily. 16 g 5  . Lycopene 10 MG CAPS Take 1 capsule by mouth daily.    . metoprolol succinate (TOPROL-XL) 25 MG 24 hr tablet Take 1 tablet (25 mg total) by mouth daily. 90 tablet 1  . Multiple Vitamin tablet Take by mouth.    . NON FORMULARY CPAP machine     . sildenafil (VIAGRA) 100 MG tablet TAKE ONE TABLET BY MOUTH AS NEEDED FOR  ERECTILE  DYSFUNCTION 20 tablet 2  . traMADol (ULTRAM) 50 MG tablet Take 1 tablet (50 mg total) by mouth every 6 (six) hours as needed. 60 tablet 1   No current facility-administered medications on file prior to visit.    BP  124/70 mmHg  Pulse 53  Temp(Src) 98.5 F (36.9 C) (Oral)  Resp 20  Ht 5' 9.5" (1.765 m)  Wt 177 lb (80.287 kg)  BMI 25.77 kg/m2  SpO2 97%    Review of Systems  Constitutional: Negative for fever, chills, appetite change and fatigue.  HENT: Negative for congestion, dental problem, ear pain, hearing loss, sore throat, tinnitus, trouble swallowing and voice change.   Eyes: Negative for pain, discharge and visual disturbance.  Respiratory: Positive for cough and shortness of breath. Negative for chest tightness, wheezing and stridor.   Cardiovascular: Negative for chest pain, palpitations and leg swelling.  Gastrointestinal: Negative for nausea, vomiting, abdominal pain, diarrhea, constipation, blood in stool and abdominal distention.  Genitourinary: Negative for urgency, hematuria, flank pain, discharge, difficulty urinating and genital sores.  Musculoskeletal: Negative for myalgias, back pain, joint swelling, arthralgias, gait problem and neck stiffness.    Skin: Negative for rash.  Neurological: Negative for dizziness, syncope, speech difficulty, weakness, numbness and headaches.  Hematological: Negative for adenopathy. Does not bruise/bleed easily.  Psychiatric/Behavioral: Negative for behavioral problems and dysphoric mood. The patient is not nervous/anxious.        Objective:   Physical Exam  Constitutional: He is oriented to person, place, and time. He appears well-developed. No distress.  HENT:  Head: Normocephalic.  Right Ear: External ear normal.  Left Ear: External ear normal.  Eyes: Conjunctivae and EOM are normal.  Neck: Normal range of motion. No JVD present.  Cardiovascular: Normal rate and regular rhythm.   Murmur heard. Pulmonary/Chest: Effort normal. He has rales.  Bilateral rales, much more prominent on the left  Abdominal: Bowel sounds are normal.  Musculoskeletal: Normal range of motion. He exhibits no edema or tenderness.  Neurological: He is alert and oriented to person, place, and time.  Psychiatric: He has a normal mood and affect. His behavior is normal.          Assessment & Plan:   Viral URI with cough Patient treated for possible early right middle lobe kidney car pneumonia  Patient feels well throughout the day, except for some mild dyspnea prominent symptoms are orthopnea and nocturnal cough.  Will check a BNP

## 2014-06-21 ENCOUNTER — Telehealth: Payer: Self-pay | Admitting: Internal Medicine

## 2014-06-21 NOTE — Telephone Encounter (Signed)
Pt called to ask for a call back about lab results

## 2014-06-21 NOTE — Telephone Encounter (Signed)
Please call/notify patient that lab/test/procedure is normal indicating no evidence of heart failure

## 2014-06-21 NOTE — Telephone Encounter (Signed)
Pt calling for lab results, please advise. 

## 2014-06-22 NOTE — Telephone Encounter (Signed)
Spoke to pt told him lab results were normal indicating no hear failure per Dr.K. Pt verbalized understanding.

## 2014-06-22 NOTE — Telephone Encounter (Signed)
Left message on voicemail to call office.  

## 2014-07-02 ENCOUNTER — Telehealth: Payer: Self-pay | Admitting: Internal Medicine

## 2014-07-02 MED ORDER — HYDROCODONE-HOMATROPINE 5-1.5 MG/5ML PO SYRP: 5.0000 mL | ORAL_SOLUTION | Freq: Four times a day (QID) | ORAL | Status: DC | PRN

## 2014-07-02 NOTE — Telephone Encounter (Signed)
Pt needs new rx hydrocodone. Pt is aware md out of office °

## 2014-07-02 NOTE — Telephone Encounter (Signed)
Left detailed message Rx ready for pickup. Rx printed and signed by Dr.K.

## 2014-12-04 ENCOUNTER — Other Ambulatory Visit: Payer: Self-pay | Admitting: Internal Medicine

## 2015-01-11 ENCOUNTER — Telehealth: Payer: Self-pay | Admitting: Internal Medicine

## 2015-01-11 MED ORDER — HYDROCODONE-HOMATROPINE 5-1.5 MG/5ML PO SYRP: 5.0000 mL | ORAL_SOLUTION | Freq: Four times a day (QID) | ORAL | Status: DC | PRN

## 2015-01-11 NOTE — Telephone Encounter (Signed)
Pt notified Rx ready for pickup. Rx printed and signed.  

## 2015-01-11 NOTE — Telephone Encounter (Signed)
Pt has an cough  And would like another rx hydrocodone cough syrup.

## 2015-01-11 NOTE — Telephone Encounter (Signed)
ok 

## 2015-01-11 NOTE — Telephone Encounter (Signed)
Please advise if okay to refill Hycodan cough syrup? 

## 2015-01-25 ENCOUNTER — Ambulatory Visit (INDEPENDENT_AMBULATORY_CARE_PROVIDER_SITE_OTHER): Payer: Commercial Managed Care - HMO

## 2015-01-25 DIAGNOSIS — Z23 Encounter for immunization: Secondary | ICD-10-CM | POA: Diagnosis not present

## 2015-04-17 ENCOUNTER — Telehealth: Payer: Self-pay | Admitting: Internal Medicine

## 2015-04-17 NOTE — Telephone Encounter (Signed)
Pt states he went to the pharmacy to pick up his Crestor and the price has tripled.  He is wanting to get another medication to take in place of the Crestor that would be cheaper.  He has [facility] for his medications.  Patient also states he only has 2 pills left.  Pt uses [facility] on [address] for his pharmacy

## 2015-04-18 MED ORDER — ROSUVASTATIN CALCIUM 10 MG PO TABS: 5.0000 mg | ORAL_TABLET | Freq: Every day | ORAL | Status: DC

## 2015-04-18 NOTE — Telephone Encounter (Signed)
Pt stopped by office, explained to him that Crestor is now generic and your Rx was for brand name. I will send over new Rx for generic Crestor and it should be cheaper. If not you need to contact insurance and find out what is on the formulary so we can change Rx. Pt verbalized understanding and Rx sent to pharmacy.

## 2015-04-18 NOTE — Telephone Encounter (Signed)
Left message on voicemail to call office.  

## 2015-04-18 NOTE — Telephone Encounter (Signed)
Patient returned call. Try cell phone first if not call the home number [phone]

## 2015-04-23 ENCOUNTER — Telehealth: Payer: Self-pay | Admitting: Internal Medicine

## 2015-04-23 MED ORDER — HYDROCODONE-HOMATROPINE 5-1.5 MG/5ML PO SYRP: 5.0000 mL | ORAL_SOLUTION | Freq: Four times a day (QID) | ORAL | Status: DC | PRN

## 2015-04-23 NOTE — Telephone Encounter (Signed)
Patient would like a refill on his Hydrocodone medication. °

## 2015-04-23 NOTE — Telephone Encounter (Signed)
ok 

## 2015-04-23 NOTE — Telephone Encounter (Signed)
Pt notified Rx ready for pickup. Rx printed and signed.  

## 2015-04-23 NOTE — Telephone Encounter (Signed)
Pt requesting refill on Hycodan cough syrup, last filled 12/2014. Please advise.

## 2015-05-17 ENCOUNTER — Other Ambulatory Visit (INDEPENDENT_AMBULATORY_CARE_PROVIDER_SITE_OTHER): Payer: Commercial Managed Care - HMO

## 2015-05-17 DIAGNOSIS — Z0000 Encounter for general adult medical examination without abnormal findings: Secondary | ICD-10-CM | POA: Diagnosis not present

## 2015-05-17 LAB — HEPATIC FUNCTION PANEL
ALK PHOS: 67 U/L (ref 39–117)
ALT: 21 U/L (ref 0–53)
AST: 21 U/L (ref 0–37)
Albumin: 4.4 g/dL (ref 3.5–5.2)
BILIRUBIN TOTAL: 1.4 mg/dL — AB (ref 0.2–1.2)
Bilirubin, Direct: 0.3 mg/dL (ref 0.0–0.3)
Total Protein: 7.4 g/dL (ref 6.0–8.3)

## 2015-05-17 LAB — POC URINALSYSI DIPSTICK (AUTOMATED)
BILIRUBIN UA: NEGATIVE
Blood, UA: NEGATIVE
Glucose, UA: NEGATIVE
KETONES UA: NEGATIVE
LEUKOCYTES UA: NEGATIVE
Nitrite, UA: NEGATIVE
PH UA: 6
Protein, UA: NEGATIVE
Urobilinogen, UA: 0.2

## 2015-05-17 LAB — CBC WITH DIFFERENTIAL/PLATELET
BASOS ABS: 0 10*3/uL (ref 0.0–0.1)
BASOS PCT: 0.3 % (ref 0.0–3.0)
EOS ABS: 0.1 10*3/uL (ref 0.0–0.7)
Eosinophils Relative: 1.2 % (ref 0.0–5.0)
HEMATOCRIT: 39.8 % (ref 39.0–52.0)
HEMOGLOBIN: 13.4 g/dL (ref 13.0–17.0)
LYMPHS PCT: 17.7 % (ref 12.0–46.0)
Lymphs Abs: 1.3 10*3/uL (ref 0.7–4.0)
MCHC: 33.6 g/dL (ref 30.0–36.0)
MCV: 95.4 fl (ref 78.0–100.0)
MONO ABS: 0.5 10*3/uL (ref 0.1–1.0)
Monocytes Relative: 7.3 % (ref 3.0–12.0)
Neutro Abs: 5.5 10*3/uL (ref 1.4–7.7)
Neutrophils Relative %: 73.5 % (ref 43.0–77.0)
Platelets: 234 10*3/uL (ref 150.0–400.0)
RBC: 4.18 Mil/uL — AB (ref 4.22–5.81)
RDW: 12.7 % (ref 11.5–15.5)
WBC: 7.4 10*3/uL (ref 4.0–10.5)

## 2015-05-17 LAB — BASIC METABOLIC PANEL
BUN: 14 mg/dL (ref 6–23)
CALCIUM: 9.6 mg/dL (ref 8.4–10.5)
CO2: 31 mEq/L (ref 19–32)
CREATININE: 1.1 mg/dL (ref 0.40–1.50)
Chloride: 101 mEq/L (ref 96–112)
GFR: 85.17 mL/min (ref >60.00)
Glucose, Bld: 95 mg/dL (ref 70–99)
Potassium: 4.3 mEq/L (ref 3.5–5.1)
Sodium: 137 mEq/L (ref 135–145)

## 2015-05-17 LAB — PSA: PSA: 6.23 ng/mL — AB (ref 0.10–4.00)

## 2015-05-17 LAB — LIPID PANEL
CHOL/HDL RATIO: 4
Cholesterol: 151 mg/dL (ref 0–200)
HDL: 41.2 mg/dL (ref >39.00)
LDL CALC: 93 mg/dL (ref 0–99)
NONHDL: 109.59
TRIGLYCERIDES: 84 mg/dL (ref 0.0–149.0)
VLDL: 16.8 mg/dL (ref 0.0–40.0)

## 2015-05-17 LAB — TSH: TSH: 1.62 u[IU]/mL (ref 0.35–4.50)

## 2015-05-23 ENCOUNTER — Other Ambulatory Visit: Payer: Self-pay | Admitting: Internal Medicine

## 2015-05-24 ENCOUNTER — Ambulatory Visit (INDEPENDENT_AMBULATORY_CARE_PROVIDER_SITE_OTHER): Payer: Commercial Managed Care - HMO | Admitting: Internal Medicine

## 2015-05-24 ENCOUNTER — Encounter: Payer: Self-pay | Admitting: Internal Medicine

## 2015-05-24 VITALS — BP 140/80 | HR 56 | Temp 98.1°F | Ht 69.75 in | Wt 174.0 lb

## 2015-05-24 DIAGNOSIS — Z0000 Encounter for general adult medical examination without abnormal findings: Secondary | ICD-10-CM | POA: Diagnosis not present

## 2015-05-24 DIAGNOSIS — I2510 Atherosclerotic heart disease of native coronary artery without angina pectoris: Secondary | ICD-10-CM

## 2015-05-24 DIAGNOSIS — I10 Essential (primary) hypertension: Secondary | ICD-10-CM

## 2015-05-24 DIAGNOSIS — R972 Elevated prostate specific antigen [PSA]: Secondary | ICD-10-CM

## 2015-05-24 DIAGNOSIS — Z86010 Personal history of colonic polyps: Secondary | ICD-10-CM

## 2015-05-24 NOTE — Progress Notes (Signed)
Pre visit review using our clinic review tool, if applicable. No additional management support is needed unless otherwise documented below in the visit note. 

## 2015-05-24 NOTE — Patient Instructions (Signed)
Limit your sodium (Salt) intake    It is important that you exercise regularly, at least 20 minutes 3 to 4 times per week.  If you develop chest pain or shortness of breath seek  medical attention.  Recheck PSA in 3 months.  We will consider urology referral at that time  Please check your blood pressure on a regular basis.  If it is consistently greater than 150/90, please make an office appointment.  Return in one year for follow-up

## 2015-05-24 NOTE — Progress Notes (Signed)
Patient ID: Ian Roman, male   DOB: 10-07-1945, 70 y.o.   MRN: [id number]  Subjective:    Patient ID: Ian Roman, male    DOB: 12-06-1945, 70 y.o.   MRN: [id number]  Coronary Artery Disease Pertinent negatives include no chest pain, chest tightness, dizziness, leg swelling, palpitations or shortness of breath.   70  -year-old patient who is seen today for an annual examination.  He states that he was evaluated in 2002 for a TIA. He has been on Plavix since that time. In 2007 he is status post coronary angiography with stenting of 2 vessels. He remains quite active and denies any cardiopulmonary symptoms. He has treated hypertension and dyslipidemia. He retired in June 2012 and has self discontinued hydrochlorothiazide.  He remains on metoprolol He had follow-up colonoscopy in July 2015 and had recurrent polyps.  He is scheduled for follow-up in 2018. Laboratory studies reviewed.  This revealed a elevated PSA.  This has occurred in the past Father died of complications of prostate cancer, a nephew age 71 with a recent diagnosis of prostate cancer;  nocturia times 2-3     1. Risk factors, based on past  M,S,F history. Patient has known coronary artery disease. Cardiovascular as factors include hypertension dyslipidemia  2.  Physical activities: Remains quite active without exercise restrictions;  exercises on a regular basis  3.  Depression/mood: No history depression or mood disorder 4.  Hearing: No deficits  5.  ADL's: Independent in all aspects of daily living  6.  Fall risk: Low  7.  Home safety: No problems identified  8.  Height weight, and visual acuity; height and weight stable no change in visual acuity  9.  Counseling: Heart healthy diet encouraged regular exercise recommended home blood pressure monitoring strongly encouraged  10. Lab orders based on risk factors: Will check lipid profile and general screening lab in the spring  11. Referral: Not appropriate at  this time  12. Care plan:  Follow up colonoscopy in 2018  13. Cognitive assessment: Alert and oriented with normal affect.  No cognitive dysfunction  14.  Preventive services will include annual clinical examination is with screening lab.  Annual examinations recommended.  He will have periodic colonoscopies due to his history of colonic polyps.  Patient was provided with a written and personalized care plan  15.  Provider list includes primary care GI ophthalmology.     Past medical history:  Coronary artery disease status post stenting 2007 History of TIA 2002 Positional vertigo Hypertension Dyslipidemia Obstructive sleep apnea ED History pneumonia 2010 Chronic prostatitis Colonic polyps status post polypectomy 2002 colonoscopy 2009  July 2015  Family history father died age 64  prostate cancer Mother died age 61 congestive heart failure 3 older sisters one died of a CNS neoplasm  Social history retired June 2012 25 year resident of [place] nonsmoker Married. One stepdaughter one grandchild    Review of Systems  Constitutional: Negative for fever, chills, activity change, appetite change and fatigue.  HENT: Negative for congestion, dental problem, ear pain, hearing loss, mouth sores, rhinorrhea, sinus pressure, sneezing, tinnitus, trouble swallowing and voice change.        Chronic positional vertigo with lying on his right side only  Eyes: Negative for photophobia, pain, redness and visual disturbance.  Respiratory: Negative for apnea, cough, choking, chest tightness, shortness of breath and wheezing.   Cardiovascular: Negative for chest pain, palpitations and leg swelling.  Gastrointestinal: Negative for nausea, vomiting, abdominal pain, diarrhea,  constipation, blood in stool, abdominal distention, anal bleeding and rectal pain.  Genitourinary: Negative for dysuria, urgency, frequency, hematuria, flank pain, decreased urine volume, discharge, penile swelling,  scrotal swelling, difficulty urinating, genital sores and testicular pain.  Musculoskeletal: Negative for myalgias, back pain, joint swelling, arthralgias, gait problem, neck pain and neck stiffness.  Skin: Negative for color change, rash and wound.  Neurological: Negative for dizziness, tremors, seizures, syncope, facial asymmetry, speech difficulty, weakness, light-headedness, numbness and headaches.  Hematological: Negative for adenopathy. Does not bruise/bleed easily.  Psychiatric/Behavioral: Negative for suicidal ideas, hallucinations, behavioral problems, confusion, sleep disturbance, self-injury, dysphoric mood, decreased concentration and agitation. The patient is not nervous/anxious.        Objective:   Physical Exam  Constitutional: He appears well-developed and well-nourished.  HENT:  Head: Normocephalic and atraumatic.  Right Ear: External ear normal.  Left Ear: External ear normal.  Nose: Nose normal.  Mouth/Throat: Oropharynx is clear and moist.  Eyes: Conjunctivae and EOM are normal. Pupils are equal, round, and reactive to light. No scleral icterus.  Neck: Normal range of motion. Neck supple. No JVD present. No thyromegaly present.  Cardiovascular: Regular rhythm, normal heart sounds and intact distal pulses.  Exam reveals no gallop and no friction rub.   No murmur heard. Pulmonary/Chest: Effort normal and breath sounds normal. He exhibits no tenderness.  Abdominal: Soft. Bowel sounds are normal. He exhibits no distension and no mass. There is no tenderness.  Genitourinary: Penis normal.  Prostate is 2-3 plus enlarged and asymmetric Left lobe much more prominent (?  Nodule)  Musculoskeletal: Normal range of motion. He exhibits no edema or tenderness.  Lymphadenopathy:    He has no cervical adenopathy.  Neurological: He is alert. He has normal reflexes. No cranial nerve deficit. Coordination normal.  Skin: Skin is warm and dry. No rash noted.  Psychiatric: He has a  normal mood and affect. His behavior is normal.          Assessment & Plan:   Preventive health examination Coronary artery disease Hypertension. His blood pressure is well controlled today; we'll continue close home blood  pressure monitoring low-salt diet Obstructive sleep apnea Elevated PSA/history of prostatitis/BPH-asymmetric with a prominent left lobe (?  nodule).  Options discussed.  Will recheck PSA in 3 months and is still elevated, will set up for urological evaluation  Followup colonoscopy 2018

## 2015-06-12 ENCOUNTER — Other Ambulatory Visit: Payer: Self-pay | Admitting: Internal Medicine

## 2015-06-13 ENCOUNTER — Telehealth: Payer: Self-pay | Admitting: Internal Medicine

## 2015-06-13 MED ORDER — METOPROLOL SUCCINATE ER 25 MG PO TB24: 25.0000 mg | ORAL_TABLET | Freq: Every day | ORAL | Status: DC

## 2015-06-13 NOTE — Telephone Encounter (Signed)
Left detailed message on voicemail Rx sent to pharmacy and due for physical. Please call office and schedule.

## 2015-06-13 NOTE — Telephone Encounter (Signed)
Pt is out and needs refill on metoprolol 25 mg #90 send to [email or web address] [email or web address]

## 2015-08-15 ENCOUNTER — Other Ambulatory Visit: Payer: Commercial Managed Care - HMO

## 2015-08-26 ENCOUNTER — Other Ambulatory Visit (INDEPENDENT_AMBULATORY_CARE_PROVIDER_SITE_OTHER): Payer: Commercial Managed Care - HMO

## 2015-08-26 DIAGNOSIS — R972 Elevated prostate specific antigen [PSA]: Secondary | ICD-10-CM | POA: Diagnosis not present

## 2015-08-27 LAB — PSA, TOTAL AND FREE
PSA FREE PCT: 21 % — AB (ref >25)
PSA FREE: 0.65 ng/mL
PSA: 3.14 ng/mL (ref <=4.00)

## 2015-08-29 ENCOUNTER — Telehealth: Payer: Self-pay | Admitting: Internal Medicine

## 2015-08-30 NOTE — Telephone Encounter (Signed)
Ian Roman was able to help. No note needed

## 2015-11-04 IMAGING — CR DG CHEST 2V
2 series · 2 of 2 positions shown · non-contrast
Comparison: None.

CLINICAL DATA: Persistent cough. Shortness of breath 5 days.
Evaluate for pneumonia.

EXAM:
CHEST  2 VIEW

[view not recorded (1 of 2)]
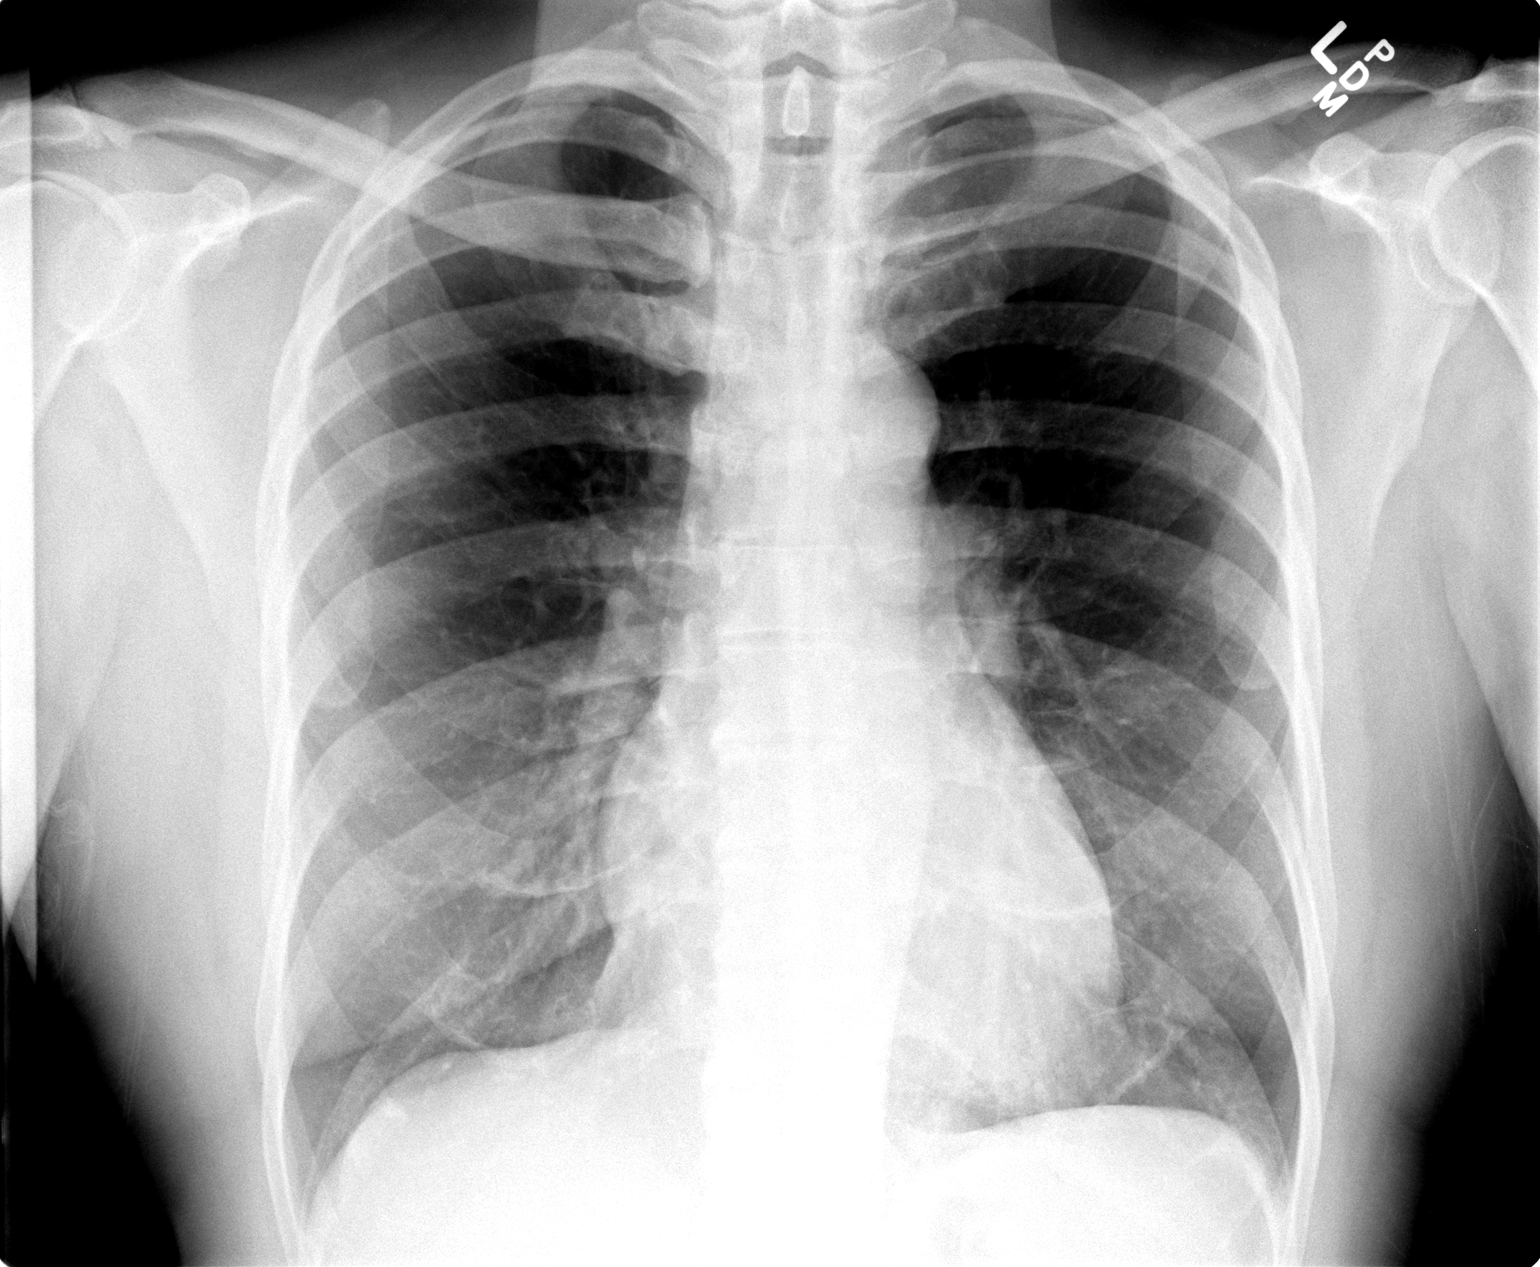

[view not recorded (2 of 2)]
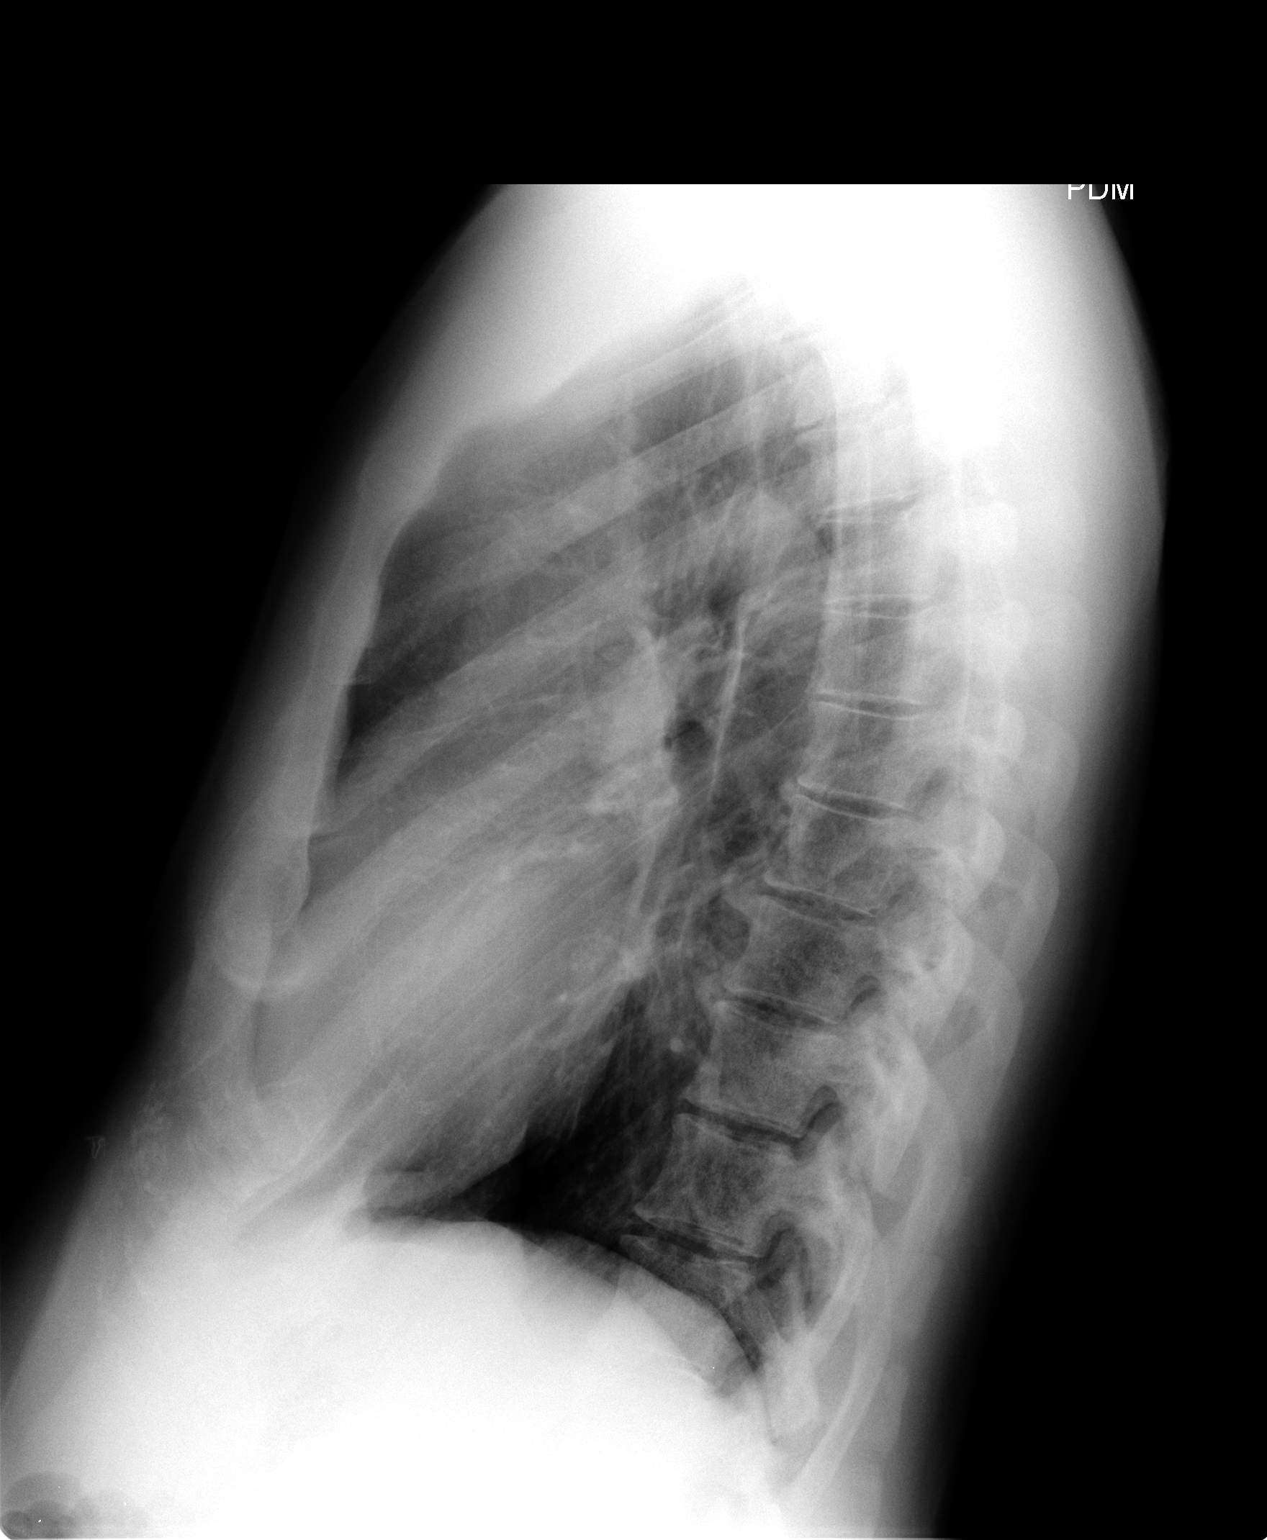

[2 of 2 positions shown; findings below may reference images not displayed]

FINDINGS: Normal cardiac silhouette and mediastinal contours. Ill-defined
heterogeneous airspace opacity with the medial aspect the right mid
lung. No pleural effusion or pneumothorax. No evidence of edema. No
acute osseus abnormalities.
IMPRESSION: Findings worrisome for developing right middle lobe pneumonia. A
follow-up chest radiograph in 4 to 6 weeks after treatment is
recommended to ensure resolution.

## 2016-01-08 ENCOUNTER — Ambulatory Visit (INDEPENDENT_AMBULATORY_CARE_PROVIDER_SITE_OTHER): Payer: Commercial Managed Care - HMO | Admitting: Internal Medicine

## 2016-01-08 DIAGNOSIS — Z23 Encounter for immunization: Secondary | ICD-10-CM

## 2016-01-14 ENCOUNTER — Encounter: Payer: Self-pay | Admitting: Family Medicine

## 2016-01-14 ENCOUNTER — Ambulatory Visit (INDEPENDENT_AMBULATORY_CARE_PROVIDER_SITE_OTHER): Payer: Commercial Managed Care - HMO | Admitting: Family Medicine

## 2016-01-14 VITALS — BP 122/68 | HR 91 | Temp 98.4°F | Ht 69.75 in | Wt 166.6 lb

## 2016-01-14 DIAGNOSIS — J069 Acute upper respiratory infection, unspecified: Secondary | ICD-10-CM | POA: Diagnosis not present

## 2016-01-14 MED ORDER — BENZONATATE 100 MG PO CAPS: 100.0000 mg | ORAL_CAPSULE | Freq: Three times a day (TID) | ORAL | 0 refills | Status: DC | PRN

## 2016-01-14 NOTE — Progress Notes (Signed)
Pre visit review using our clinic review tool, if applicable. No additional management support is needed unless otherwise documented below in the visit note. 

## 2016-01-14 NOTE — Progress Notes (Signed)
.   HPI:  Acute visit for: "Bronchitis" -started: about 1 week ago -symptoms:nasal congestion, sore throat, cough - productive clear to yellow -denies:fever, SOB, NVD, tooth pain -has tried: musinex -sick contacts/travel/risks: no reported flu, strep or tick exposure -Hx of: seasonal allergies  ROS: See pertinent positives and negatives per HPI.  Past Medical History:  Diagnosis Date  . Coronary artery disease    Status post stenting x2 2007  . Hyperlipidemia   . Hypertension   . Personal history of colonic polyps   . Prostatitis   . Stroke ([place])    tia   . Vertigo     Past Surgical History:  Procedure Laterality Date  . COLONOSCOPY    . CORONARY ANGIOPLASTY WITH STENT PLACEMENT     x2   2007    Family History  Problem Relation Age of Onset  . Heart disease Mother   . Cancer Father     prostate ca    Social History   Social History  . Marital status: Married    Spouse name: N/A  . Number of children: N/A  . Years of education: N/A   Social History Main Topics  . Smoking status: Never Smoker  . Smokeless tobacco: Never Used  . Alcohol use Yes  . Drug use: No  . Sexual activity: Not Asked   Other Topics Concern  . None   Social History Narrative  . None     Current Outpatient Prescriptions:  .  aspirin 81 MG tablet, Take 81 mg by mouth daily., Disp: , Rfl:  .  clopidogrel (PLAVIX) 75 MG tablet, TAKE ONE TABLET BY MOUTH ONCE DAILY, Disp: 90 tablet, Rfl: 1 .  co-enzyme Q-10 30 MG capsule, Take 30 mg by mouth daily., Disp: , Rfl:  .  Flaxseed, Linseed, (FLAXSEED OIL) 1000 MG CAPS, Take by mouth., Disp: , Rfl:  .  fluticasone (FLONASE) 50 MCG/ACT nasal spray, Place 2 sprays into both nostrils daily., Disp: 16 g, Rfl: 5 .  HYDROcodone-homatropine (HYCODAN) 5-1.5 MG/5ML syrup, Take 5 mLs by mouth every 6 (six) hours as needed for cough., Disp: 120 mL, Rfl: 0 .  Lycopene 10 MG CAPS, Take 1 capsule by mouth daily., Disp: , Rfl:  .  metoprolol succinate  (TOPROL-XL) 25 MG 24 hr tablet, Take 1 tablet (25 mg total) by mouth daily., Disp: 90 tablet, Rfl: 1 .  Multiple Vitamin tablet, Take by mouth., Disp: , Rfl:  .  NON FORMULARY, CPAP machine , Disp: , Rfl:  .  rosuvastatin (CRESTOR) 10 MG tablet, Take 0.5 tablets (5 mg total) by mouth daily., Disp: 45 tablet, Rfl: 3 .  sildenafil (VIAGRA) 100 MG tablet, TAKE ONE TABLET BY MOUTH AS NEEDED FOR  ERECTILE  DYSFUNCTION, Disp: 20 tablet, Rfl: 2 .  traMADol (ULTRAM) 50 MG tablet, Take 1 tablet (50 mg total) by mouth every 6 (six) hours as needed., Disp: 60 tablet, Rfl: 1 .  benzonatate (TESSALON) 100 MG capsule, Take 1 capsule (100 mg total) by mouth 3 (three) times daily as needed for cough., Disp: 30 capsule, Rfl: 0  EXAM:  Vitals:   01/14/16 0950  BP: 122/68  Pulse: 91  Temp: 98.4 F (36.9 C)    Body mass index is 24.08 kg/m.  GENERAL: vitals reviewed and listed above, alert, oriented, appears well hydrated and in no acute distress  HEENT: atraumatic, conjunttiva clear, no obvious abnormalities on inspection of external nose and ears, normal appearance of ear canals and TMs, clear nasal  congestion, mild post oropharyngeal erythema with PND, no tonsillar edema or exudate, no sinus TTP  NECK: no obvious masses on inspection  LUNGS: clear to auscultation bilaterally, no wheezes, rales or rhonchi, good air movement  CV: HRRR, no peripheral edema  MS: moves all extremities without noticeable abnormality  PSYCH: pleasant and cooperative, no obvious depression or anxiety  ASSESSMENT AND PLAN:  Discussed the following assessment and plan:  Acute upper respiratory infection - Plan: benzonatate (TESSALON) 100 MG capsule  -given HPI and exam findings today, a serious infection or illness is unlikely. We discussed potential etiologies, with VURI being most likely, and advised supportive care and monitoring. Tessalon for cough as needed. We discussed treatment side effects, likely course,  antibiotic misuse, transmission, and signs of developing a serious illness. -of course, we advised to return or notify a doctor immediately if symptoms worsen or persist or new concerns arise.  Patient Instructions  INSTRUCTIONS FOR UPPER RESPIRATORY INFECTION:  -nasal saline wash 2-3 times daily (use prepackaged nasal saline or bottled/distilled water if making your own)   -can use AFRIN nasal spray for drainage and nasal congestion - but do NOT use longer then 3-4 days  -can use tylenol (in no history of liver disease) or ibuprofen (if no history of kidney disease, bowel bleeding or significant heart disease) as directed for aches and sorethroat  -in the winter time, using a humidifier at night is helpful (please follow cleaning instructions)  -if you are taking a cough medication - use only as directed, may also try a teaspoon of honey to coat the throat and throat lozenges.  -for sore throat, salt water gargles can help  -follow up if you have fevers, facial pain, tooth pain, difficulty breathing or are worsening or symptoms persist longer then expected  Upper Respiratory Infection, Adult An upper respiratory infection (URI) is also known as the common cold. It is often caused by a type of germ (virus). Colds are easily spread (contagious). You can pass it to others by kissing, coughing, sneezing, or drinking out of the same glass. Usually, you get better in 1 to 3  weeks.  However, the cough can last for even longer. HOME CARE   Only take medicine as told by your doctor. Follow instructions provided above.  Drink enough water and fluids to keep your pee (urine) clear or pale yellow.  Get plenty of rest.  Return to work when your temperature is < 100 for 24 hours or as told by your doctor. You may use a face mask and wash your hands to stop your cold from spreading. GET HELP RIGHT AWAY IF:   After the first few days, you feel you are getting worse.  You have questions about  your medicine.  You have chills, shortness of breath, or red spit (mucus).  You have pain in the face for more then 1-2 days, especially when you bend forward.  You have a fever, puffy (swollen) neck, pain when you swallow, or white spots in the back of your throat.  You have a bad headache, ear pain, sinus pain, or chest pain.  You have a high-pitched whistling sound when you breathe in and out (wheezing).  You cough up blood.  You have sore muscles or a stiff neck. MAKE SURE YOU:   Understand these instructions.  Will watch your condition.  Will get help right away if you are not doing well or get worse. Document Released: 08/26/2007 Document Revised: 06/01/2011 Document Reviewed: 06/14/2013 ExitCare  Patient Information 2015 [place]. This information is not intended to replace advice given to you by your health care provider. Make sure you discuss any questions you have with your health care provider.    Colin Benton R., DO

## 2016-01-14 NOTE — Patient Instructions (Signed)
INSTRUCTIONS FOR UPPER RESPIRATORY INFECTION:  -nasal saline wash 2-3 times daily (use prepackaged nasal saline or bottled/distilled water if making your own)   -can use AFRIN nasal spray for drainage and nasal congestion - but do NOT use longer then 3-4 days  -can use tylenol (in no history of liver disease) or ibuprofen (if no history of kidney disease, bowel bleeding or significant heart disease) as directed for aches and sorethroat  -in the winter time, using a humidifier at night is helpful (please follow cleaning instructions)  -if you are taking a cough medication - use only as directed, may also try a teaspoon of honey to coat the throat and throat lozenges.  -for sore throat, salt water gargles can help  -follow up if you have fevers, facial pain, tooth pain, difficulty breathing or are worsening or symptoms persist longer then expected  Upper Respiratory Infection, Adult An upper respiratory infection (URI) is also known as the common cold. It is often caused by a type of germ (virus). Colds are easily spread (contagious). You can pass it to others by kissing, coughing, sneezing, or drinking out of the same glass. Usually, you get better in 1 to 3  weeks.  However, the cough can last for even longer. HOME CARE   Only take medicine as told by your doctor. Follow instructions provided above.  Drink enough water and fluids to keep your pee (urine) clear or pale yellow.  Get plenty of rest.  Return to work when your temperature is < 100 for 24 hours or as told by your doctor. You may use a face mask and wash your hands to stop your cold from spreading. GET HELP RIGHT AWAY IF:   After the first few days, you feel you are getting worse.  You have questions about your medicine.  You have chills, shortness of breath, or red spit (mucus).  You have pain in the face for more then 1-2 days, especially when you bend forward.  You have a fever, puffy (swollen) neck, pain when you  swallow, or white spots in the back of your throat.  You have a bad headache, ear pain, sinus pain, or chest pain.  You have a high-pitched whistling sound when you breathe in and out (wheezing).  You cough up blood.  You have sore muscles or a stiff neck. MAKE SURE YOU:   Understand these instructions.  Will watch your condition.  Will get help right away if you are not doing well or get worse. Document Released: 08/26/2007 Document Revised: 06/01/2011 Document Reviewed: 06/14/2013 ExitCare Patient Information 2015 [employer]. This information is not intended to replace advice given to you by your health care provider. Make sure you discuss any questions you have with your health care provider.  

## 2016-01-27 ENCOUNTER — Other Ambulatory Visit: Payer: Self-pay | Admitting: Internal Medicine

## 2016-04-14 ENCOUNTER — Other Ambulatory Visit: Payer: Self-pay | Admitting: Internal Medicine

## 2016-07-15 ENCOUNTER — Other Ambulatory Visit: Payer: Self-pay | Admitting: *Deleted

## 2016-07-15 MED ORDER — ROSUVASTATIN CALCIUM 10 MG PO TABS: 5.0000 mg | ORAL_TABLET | Freq: Every day | ORAL | 0 refills | Status: DC

## 2016-07-19 ENCOUNTER — Other Ambulatory Visit: Payer: Self-pay | Admitting: Internal Medicine

## 2016-08-10 ENCOUNTER — Other Ambulatory Visit: Payer: Self-pay | Admitting: Internal Medicine

## 2016-08-10 DIAGNOSIS — R972 Elevated prostate specific antigen [PSA]: Secondary | ICD-10-CM

## 2016-08-10 DIAGNOSIS — E785 Hyperlipidemia, unspecified: Secondary | ICD-10-CM

## 2016-08-14 ENCOUNTER — Other Ambulatory Visit (INDEPENDENT_AMBULATORY_CARE_PROVIDER_SITE_OTHER): Payer: Medicare PPO

## 2016-08-14 DIAGNOSIS — R972 Elevated prostate specific antigen [PSA]: Secondary | ICD-10-CM | POA: Diagnosis not present

## 2016-08-14 DIAGNOSIS — E785 Hyperlipidemia, unspecified: Secondary | ICD-10-CM | POA: Diagnosis not present

## 2016-08-14 LAB — POC URINALSYSI DIPSTICK (AUTOMATED)
BILIRUBIN UA: NEGATIVE
Glucose, UA: NEGATIVE
KETONES UA: NEGATIVE
LEUKOCYTES UA: NEGATIVE
Nitrite, UA: NEGATIVE
Protein, UA: NEGATIVE
Urobilinogen, UA: 0.2 E.U./dL
pH, UA: 6 (ref 5.0–8.0)

## 2016-08-14 LAB — BASIC METABOLIC PANEL
BUN: 15 mg/dL (ref 6–23)
CO2: 27 mEq/L (ref 19–32)
CREATININE: 1.15 mg/dL (ref 0.40–1.50)
Calcium: 9.3 mg/dL (ref 8.4–10.5)
Chloride: 102 mEq/L (ref 96–112)
GFR: 80.62 mL/min (ref >60.00)
Glucose, Bld: 98 mg/dL (ref 70–99)
Potassium: 3.8 mEq/L (ref 3.5–5.1)
Sodium: 137 mEq/L (ref 135–145)

## 2016-08-14 LAB — LIPID PANEL
CHOL/HDL RATIO: 4
Cholesterol: 135 mg/dL (ref 0–200)
HDL: 35.8 mg/dL — ABNORMAL LOW (ref >39.00)
LDL Cholesterol: 86 mg/dL (ref 0–99)
NonHDL: 99.55
TRIGLYCERIDES: 69 mg/dL (ref 0.0–149.0)
VLDL: 13.8 mg/dL (ref 0.0–40.0)

## 2016-08-14 LAB — CBC
HCT: 37.3 % — ABNORMAL LOW (ref 39.0–52.0)
HEMOGLOBIN: 12.8 g/dL — AB (ref 13.0–17.0)
MCHC: 34.4 g/dL (ref 30.0–36.0)
MCV: 94 fl (ref 78.0–100.0)
PLATELETS: 276 10*3/uL (ref 150.0–400.0)
RBC: 3.97 Mil/uL — ABNORMAL LOW (ref 4.22–5.81)
RDW: 12.2 % (ref 11.5–15.5)
WBC: 4.3 10*3/uL (ref 4.0–10.5)

## 2016-08-14 LAB — HEPATIC FUNCTION PANEL
ALT: 10 U/L (ref 0–53)
AST: 17 U/L (ref 0–37)
Albumin: 4.3 g/dL (ref 3.5–5.2)
Alkaline Phosphatase: 54 U/L (ref 39–117)
BILIRUBIN DIRECT: 0.2 mg/dL (ref 0.0–0.3)
BILIRUBIN TOTAL: 1.3 mg/dL — AB (ref 0.2–1.2)
Total Protein: 7 g/dL (ref 6.0–8.3)

## 2016-08-14 LAB — PSA: PSA: 6.26 ng/mL — ABNORMAL HIGH (ref 0.10–4.00)

## 2016-08-20 ENCOUNTER — Other Ambulatory Visit: Payer: Self-pay | Admitting: Internal Medicine

## 2016-08-21 ENCOUNTER — Encounter: Payer: Self-pay | Admitting: Internal Medicine

## 2016-08-21 ENCOUNTER — Ambulatory Visit (INDEPENDENT_AMBULATORY_CARE_PROVIDER_SITE_OTHER): Payer: Medicare PPO | Admitting: Internal Medicine

## 2016-08-21 VITALS — BP 132/78 | HR 76 | Temp 98.2°F | Ht 69.0 in | Wt 165.6 lb

## 2016-08-21 DIAGNOSIS — Z86010 Personal history of colonic polyps: Secondary | ICD-10-CM

## 2016-08-21 DIAGNOSIS — I10 Essential (primary) hypertension: Secondary | ICD-10-CM | POA: Diagnosis not present

## 2016-08-21 DIAGNOSIS — Z0000 Encounter for general adult medical examination without abnormal findings: Secondary | ICD-10-CM | POA: Diagnosis not present

## 2016-08-21 DIAGNOSIS — R972 Elevated prostate specific antigen [PSA]: Secondary | ICD-10-CM

## 2016-08-21 DIAGNOSIS — I2510 Atherosclerotic heart disease of native coronary artery without angina pectoris: Secondary | ICD-10-CM | POA: Diagnosis not present

## 2016-08-21 DIAGNOSIS — E785 Hyperlipidemia, unspecified: Secondary | ICD-10-CM | POA: Diagnosis not present

## 2016-08-21 NOTE — Progress Notes (Signed)
Subjective:    Patient ID: Ian Roman, male    DOB: June 14, 1945, 71 y.o.   MRN: [id number]  HPI  71 year-old patient who is seen today for an annual examination.  Patient has a history of cerebral vascular disease and was evaluated for a TIA in 2002.  He has been on Plavix since that time.  In 2007.  He underwent PCI of 2 vessels.  He remains quite active and denies any cardiopulmonary symptoms.  He has treated hypertension and dyslipidemia. He retired in June 2012. He had follow-up colonoscopy in July 2015 and had recurrent polyps.  He was told to have follow-up colonoscopy in 2018. Laboratory studies reviewed.  This revealed a elevated PSA.  This has occurred in the past Father died of complications of prostate cancer, a nephew age 24 with a recent diagnosis of prostate cancer;  nocturia times 2-3   Past Medical History:  Diagnosis Date  . Coronary artery disease    Status post stenting x2 2007  . Hyperlipidemia   . Hypertension   . Personal history of colonic polyps   . Prostatitis   . Stroke ([place])    tia   . Vertigo      Social History   Social History  . Marital status: Married    Spouse name: N/A  . Number of children: N/A  . Years of education: N/A   Occupational History  . Not on file.   Social History Main Topics  . Smoking status: Never Smoker  . Smokeless tobacco: Never Used  . Alcohol use Yes  . Drug use: No  . Sexual activity: Not on file   Other Topics Concern  . Not on file   Social History Narrative  . No narrative on file    Past Surgical History:  Procedure Laterality Date  . COLONOSCOPY    . CORONARY ANGIOPLASTY WITH STENT PLACEMENT     x2   2007    Family History  Problem Relation Age of Onset  . Heart disease Mother   . Cancer Father        prostate ca    Allergies  Allergen Reactions  . Zocor [Simvastatin]     Intolerance    Current Outpatient Prescriptions on File Prior to Visit  Medication Sig Dispense Refill    . aspirin 81 MG tablet Take 81 mg by mouth daily.    . benzonatate (TESSALON) 100 MG capsule Take 1 capsule (100 mg total) by mouth 3 (three) times daily as needed for cough. 30 capsule 0  . clopidogrel (PLAVIX) 75 MG tablet TAKE ONE TABLET BY MOUTH ONCE DAILY 90 tablet 1  . co-enzyme Q-10 30 MG capsule Take 30 mg by mouth daily.    . Flaxseed, Linseed, (FLAXSEED OIL) 1000 MG CAPS Take by mouth.    . fluticasone (FLONASE) 50 MCG/ACT nasal spray Place 2 sprays into both nostrils daily. 16 g 5  . HYDROcodone-homatropine (HYCODAN) 5-1.5 MG/5ML syrup Take 5 mLs by mouth every 6 (six) hours as needed for cough. 120 mL 0  . Lycopene 10 MG CAPS Take 1 capsule by mouth daily.    . metoprolol succinate (TOPROL-XL) 25 MG 24 hr tablet TAKE ONE TABLET BY MOUTH ONCE DAILY 90 tablet 1  . Multiple Vitamin tablet Take by mouth.    . NON FORMULARY CPAP machine     . rosuvastatin (CRESTOR) 10 MG tablet Take 0.5 tablets (5 mg total) by mouth daily. 45 tablet 0  .  sildenafil (VIAGRA) 100 MG tablet TAKE ONE TABLET BY MOUTH AS NEEDED FOR  ERECTILE  DYSFUNCTION 20 tablet 2  . traMADol (ULTRAM) 50 MG tablet Take 1 tablet (50 mg total) by mouth every 6 (six) hours as needed. 60 tablet 1   No current facility-administered medications on file prior to visit.     BP 132/78 (BP Location: Left Arm, Patient Position: Sitting, Cuff Size: Normal)   Pulse 76   Temp 98.2 F (36.8 C) (Oral)   Ht 5\' 9"  (1.753 m)   Wt 165 lb 9.6 oz (75.1 kg)   SpO2 98%   BMI 24.45 kg/m    Medicare wellness visit  1. Risk factors, based on past  M,S,F history. Patient has known coronary artery disease. Cardiovascular as factors include hypertension dyslipidemia  2.  Physical activities: Remains quite active without exercise restrictions;  exercises on a regular basis; walks daily with his wife  3.  Depression/mood: No history depression or mood disorder 4.  Hearing: No deficits  5.  ADL's: Independent in all aspects of daily  living  6.  Fall risk: Low  7.  Home safety: No problems identified  8.  Height weight, and visual acuity; height and weight stable no change in visual acuity  9.  Counseling: Heart healthy diet encouraged regular exercise recommended home blood pressure monitoring strongly encouraged  10. Lab orders based on risk factors: Will check lipid profile and general screening lab in the spring  11. Referral: Not appropriate at this time  12. Care plan:  Follow up colonoscopy in 2018.  Will schedule.  We'll recheck a PSA in 3 months and is still elevated, will consider urological referral  13. Cognitive assessment: Alert and oriented with normal affect.  No cognitive dysfunction  14.  Preventive services will include annual clinical examination is with screening lab.  Annual examinations recommended.  He will have periodic colonoscopies due to his history of colonic polyps.  Patient was provided with a written and personalized care plan  15.  Provider list includes primary care GI ophthalmology.     Past medical history:  Coronary artery disease status post stenting 2007 History of TIA 2002 Positional vertigo Hypertension Dyslipidemia Obstructive sleep apnea ED History pneumonia 2010 Chronic prostatitis Colonic polyps status post polypectomy 2002 colonoscopy 2009  July 2015  Family history father died age 35  prostate cancer Mother died age 59 congestive heart failure 3 older sisters one died of a CNS neoplasm  Social history retired June 2012 25 year resident of [place] nonsmoker Married. One stepdaughter one grandchild   Review of Systems  Constitutional: Negative for appetite change, chills, fatigue and fever.  HENT: Negative for congestion, dental problem, ear pain, hearing loss, sore throat, tinnitus, trouble swallowing and voice change.   Eyes: Negative for pain, discharge and visual disturbance.  Respiratory: Negative for cough, chest  tightness, wheezing and stridor.   Cardiovascular: Negative for chest pain, palpitations and leg swelling.  Gastrointestinal: Negative for abdominal distention, abdominal pain, blood in stool, constipation, diarrhea, nausea and vomiting.  Genitourinary: Negative for difficulty urinating, discharge, flank pain, genital sores, hematuria and urgency.  Musculoskeletal: Negative for arthralgias, back pain, gait problem, joint swelling, myalgias and neck stiffness.  Skin: Negative for rash.  Neurological: Negative for dizziness, syncope, speech difficulty, weakness, numbness and headaches.  Hematological: Negative for adenopathy. Does not bruise/bleed easily.  Psychiatric/Behavioral: Negative for behavioral problems and dysphoric mood. The patient is not nervous/anxious.        Objective:  Physical Exam  Constitutional: He is oriented to person, place, and time. He appears well-developed.  HENT:  Head: Normocephalic.  Right Ear: External ear normal.  Left Ear: External ear normal.  Eyes: Conjunctivae and EOM are normal.  Neck: Normal range of motion.  Cardiovascular: Normal rate and normal heart sounds.   Pulmonary/Chest: Breath sounds normal.  Abdominal: Bowel sounds are normal.  Genitourinary: Rectal exam shows guaiac negative stool.  Genitourinary Comments: Prostate  moderately enlarged and symmetrical  Musculoskeletal: Normal range of motion. He exhibits no edema or tenderness.  Neurological: He is alert and oriented to person, place, and time.  Psychiatric: He has a normal mood and affect. His behavior is normal.          Assessment & Plan:   Preventive health examination Medicare wellness visit BPH/intermittently elevated PSA.  Will recheck in 3-4 months.  If there is a continued upward trend.  Will refer to urology Coronary artery disease, stable.  Continue statin therapy Essential hypertension, well-controlled  Follow-up 6 months  Charlot Gouin [name]

## 2016-08-21 NOTE — Patient Instructions (Addendum)
WE NOW OFFER    [facility] FAST TRACK!!!  SAME DAY Appointments for ACUTE CARE  Such as: Sprains, Injuries, cuts, abrasions, rashes, muscle pain, joint pain, back pain Colds, flu, sore throats, headache, allergies, cough, fever  Ear pain, sinus and eye infections Abdominal pain, nausea, vomiting, diarrhea, upset stomach Animal/insect bites  3 Easy Ways to Schedule: Walk-In Scheduling Call in scheduling Mychart Sign-up: [email or web address]  Return in approximately 3 months for a PSA determination  Return in 6 months for follow-up    It is important that you exercise regularly, at least 20 minutes 3 to 4 times per week.  If you develop chest pain or shortness of breath seek  medical attention.  Please check your blood pressure on a regular basis.  If it is consistently greater than 150/90, please make an office appointment.  Limit your sodium (Salt) intake  Schedule your colonoscopy to help detect colon cancer.

## 2016-09-22 ENCOUNTER — Encounter: Payer: Self-pay | Admitting: Internal Medicine

## 2016-10-21 ENCOUNTER — Encounter: Payer: Self-pay | Admitting: Nurse Practitioner

## 2016-11-03 ENCOUNTER — Encounter: Payer: Self-pay | Admitting: Nurse Practitioner

## 2016-11-03 ENCOUNTER — Telehealth: Payer: Self-pay

## 2016-11-03 ENCOUNTER — Ambulatory Visit (INDEPENDENT_AMBULATORY_CARE_PROVIDER_SITE_OTHER): Payer: Medicare PPO | Admitting: Nurse Practitioner

## 2016-11-03 VITALS — BP 142/80 | HR 56 | Ht 69.25 in | Wt 166.4 lb

## 2016-11-03 DIAGNOSIS — Z1211 Encounter for screening for malignant neoplasm of colon: Secondary | ICD-10-CM | POA: Diagnosis not present

## 2016-11-03 DIAGNOSIS — Z7901 Long term (current) use of anticoagulants: Secondary | ICD-10-CM | POA: Diagnosis not present

## 2016-11-03 NOTE — Telephone Encounter (Signed)
  [facility] [address] [place], [place]  [address] Phone:  [phone]   Fax:  [phone]  11/03/2016   RE:      Ian Roman DOB:   [birth year] MRN:   [id number]   Dear Dr. Burnice Logan,    We have scheduled the above patient for an endoscopic procedure. Our records show that he is on anticoagulation therapy.   Please advise as to how long the patient may come off his therapy of Plavix prior to the Colonoscopy procedure, which is scheduled for 11/13/16.  Please fax back/ or route the completed form to [facility] [place] at [phone].   Sincerely,    Thurmon Fair, RMA

## 2016-11-03 NOTE — Progress Notes (Addendum)
HPI:  It is a 71 year old male known to Dr. Carlean Purl. He is referred by PCP, Dr  Burnice Logan for colon cancer screening.  He had 3 adenomatous polyps removed at time of last colonoscopy July 2015 and is now due for surveillance colonoscopy. Patient had a TIA in 2002, he is status post cardiac stent placement in 2007. He is maintained on Plavix. Mr. Thomley has no abdominal pain, blood in stool or unusual weight loss. He has occasional constipation managed with metamucil.  He feels great from a cardiac standpoint. No chest pain, shortness of breath or fatigue.    Past Medical History:  Diagnosis Date  . Coronary artery disease    Status post stenting x2 2007  . Hyperlipidemia   . Hypertension   . Personal history of colonic polyps   . Prostatitis   . Stroke ([place])    tia   . Vertigo      Past Surgical History:  Procedure Laterality Date  . COLONOSCOPY    . CORONARY ANGIOPLASTY WITH STENT PLACEMENT     x2   2007   Family History  Problem Relation Age of Onset  . Heart disease Mother   . Cancer Father        prostate ca   Social History  Substance Use Topics  . Smoking status: Never Smoker  . Smokeless tobacco: Never Used  . Alcohol use Yes   Current Outpatient Prescriptions  Medication Sig Dispense Refill  . aspirin 81 MG tablet Take 81 mg by mouth daily.    . clopidogrel (PLAVIX) 75 MG tablet TAKE ONE TABLET BY MOUTH ONCE DAILY 90 tablet 1  . co-enzyme Q-10 30 MG capsule Take 30 mg by mouth daily.    . Flaxseed, Linseed, (FLAXSEED OIL) 1000 MG CAPS Take by mouth.    . fluticasone (FLONASE) 50 MCG/ACT nasal spray Place 2 sprays into both nostrils daily. 16 g 5  . HYDROcodone-homatropine (HYCODAN) 5-1.5 MG/5ML syrup Take 5 mLs by mouth every 6 (six) hours as needed for cough. 120 mL 0  . Lycopene 10 MG CAPS Take 1 capsule by mouth daily.    . metoprolol succinate (TOPROL-XL) 25 MG 24 hr tablet TAKE ONE TABLET BY MOUTH ONCE DAILY 90 tablet 1  . Multiple Vitamin  tablet Take by mouth.    . NON FORMULARY CPAP machine     . rosuvastatin (CRESTOR) 10 MG tablet Take 0.5 tablets (5 mg total) by mouth daily. 45 tablet 0  . sildenafil (VIAGRA) 100 MG tablet TAKE ONE TABLET BY MOUTH AS NEEDED FOR  ERECTILE  DYSFUNCTION 20 tablet 2  . traMADol (ULTRAM) 50 MG tablet Take 1 tablet (50 mg total) by mouth every 6 (six) hours as needed. 60 tablet 1   No current facility-administered medications for this visit.    Allergies  Allergen Reactions  . Zocor [Simvastatin]     Intolerance     Review of Systems: All systems reviewed and negative except where noted in HPI.    Physical Exam: BP (!) 142/80   Pulse (!) 56   Ht 5' 9.25" (1.759 m) Comment: height measured without shoes  Wt 166 lb 7 oz (75.5 kg)   BMI 24.40 kg/m  Constitutional:  Well-developed, black male in no acute distress. Psychiatric: Normal mood and affect. Behavior is normal. EENT: Pupils normal.  Conjunctivae are normal. No scleral icterus. Neck supple.  Cardiovascular: Normal rate, regular rhythm. No edema Pulmonary/chest: Effort normal and breath sounds normal. No  wheezing, rales or rhonchi. Abdominal: Soft, nondistended. Nontender. Bowel sounds active throughout. There are no masses palpable. No hepatomegaly. Lymphadenopathy: No cervical adenopathy noted. Neurological: Alert and oriented to person place and time. Skin: Skin is warm and dry. No rashes noted.   ASSESSMENT AND PLAN:  1.  71 yo male with hx of adenomatous colon polyps, due for surveillance colonoscopy.  Patient will be scheduled for a surveillance colonoscopy with possible polypectomy.  The risks and benefits of the procedure were discussed and the patient agrees to proceed.   2. Chronic anti-platelet therapy.  -Hold plavix for 5 days before procedure - will instruct when and how to resume after procedure. Patient understands that there is a low but real risk of cardiovascular event such as heart attack, stroke,  embolism, thrombosis or ischemia / infarct of other organs off plavix.  He agrees to proceed with the procedure. Will communicate by phone or EMR with patient's prescribing provider to confirm that holding plavix is reasonable in this case.   3. OSA, on CPAP  Tye Savoy, NP  11/03/2016, 10:05 AM   Marletta Lor, MD  Agree with Ms. Titilayo Hagans's assessment and plan. Gatha Mayer, MD, [facility]

## 2016-11-03 NOTE — Patient Instructions (Signed)
If you are age 71 or older, your body mass index should be between 23-30. Your Body mass index is 24.4 kg/m. If this is out of the aforementioned range listed, please consider follow up with your Primary Care Provider.  If you are age 75 or younger, your body mass index should be between 19-25. Your Body mass index is 24.4 kg/m. If this is out of the aformentioned range listed, please consider follow up with your Primary Care Provider.   You have been scheduled for a colonoscopy. Please follow written instructions given to you at your visit today.  Please pick up your prep supplies at the pharmacy within the next 1-3 days. If you use inhalers (even only as needed), please bring them with you on the day of your procedure. Your physician has requested that you go to www.startemmi.com and enter the access code given to you at your visit today. This web site gives a general overview about your procedure. However, you should still follow specific instructions given to you by our office regarding your preparation for the procedure.  You may have a light breakfast the morning of prep day (the day before the procedure).   You may choose from one of the following items: eggs and toast OR chicken noodle soup and crackers.   You should have your breakfast completed between 8:00 and 9:00 am the day before your procedure.    After you have had your light breakfast you should start a clear liquid diet only, NO SOLIDS. No additional solid food is allowed. You may continue to have clear liquid up to 3 hours prior to your procedure.   You will be contacted by our office prior to your procedure for directions on holding your Plavix.  If you do not hear from our office 1 week prior to your scheduled procedure, please call [phone] to discuss.   Thank you for choosing me and [facility].   Tye Savoy, NP

## 2016-11-04 NOTE — Telephone Encounter (Signed)
OK to hold plavix 5 days prior to procedure. PK

## 2016-11-05 NOTE — Telephone Encounter (Signed)
Spoke with patient today regarding holding Plavix 5 days prior to colonoscopy - per Dr. Laural Benes to hold.  Zollie Beckers, [place]

## 2016-11-13 ENCOUNTER — Ambulatory Visit (AMBULATORY_SURGERY_CENTER): Payer: Medicare PPO | Admitting: Internal Medicine

## 2016-11-13 ENCOUNTER — Encounter: Payer: Self-pay | Admitting: Internal Medicine

## 2016-11-13 VITALS — BP 138/69 | HR 63 | Temp 98.0°F | Resp 11

## 2016-11-13 DIAGNOSIS — D123 Benign neoplasm of transverse colon: Secondary | ICD-10-CM | POA: Diagnosis not present

## 2016-11-13 DIAGNOSIS — Z86010 Personal history of colonic polyps: Secondary | ICD-10-CM | POA: Diagnosis not present

## 2016-11-13 DIAGNOSIS — I10 Essential (primary) hypertension: Secondary | ICD-10-CM | POA: Diagnosis not present

## 2016-11-13 DIAGNOSIS — I2510 Atherosclerotic heart disease of native coronary artery without angina pectoris: Secondary | ICD-10-CM | POA: Diagnosis not present

## 2016-11-13 MED ORDER — SODIUM CHLORIDE 0.9 % IV SOLN: 500.0000 mL | INTRAVENOUS | Status: DC

## 2016-11-13 NOTE — Progress Notes (Signed)
To recovery, report to Scott, RN, VSS 

## 2016-11-13 NOTE — Progress Notes (Signed)
Pt's states no medical or surgical changes since previsit or office visit. 

## 2016-11-13 NOTE — Op Note (Signed)
[facility] Patient Name: Ian Roman Procedure Date: 11/13/2016 1:24 PM MRN: [id number] Endoscopist: Gatha Mayer , MD Age: 71 Referring MD:  Date of Birth: 10-27-1945 Gender: Male Account #: [id number] Procedure:                Colonoscopy Indications:              Surveillance: Personal history of adenomatous                            polyps on last colonoscopy 3 years ago Medicines:                Propofol per Anesthesia, Monitored Anesthesia Care Procedure:                Pre-Anesthesia Assessment:                           - Prior to the procedure, a History and Physical                            was performed, and patient medications and                            allergies were reviewed. The patient's tolerance of                            previous anesthesia was also reviewed. The risks                            and benefits of the procedure and the sedation                            options and risks were discussed with the patient.                            All questions were answered, and informed consent                            was obtained. Prior Anticoagulants: The patient                            last took Plavix (clopidogrel) 5 days prior to the                            procedure. ASA Grade Assessment: III - A patient                            with severe systemic disease. After reviewing the                            risks and benefits, the patient was deemed in                            satisfactory condition to undergo the procedure.  After obtaining informed consent, the colonoscope                            was passed under direct vision. Throughout the                            procedure, the patient's blood pressure, pulse, and                            oxygen saturations were monitored continuously. The                            Colonoscope was introduced through the anus and    advanced to the the cecum, identified by                            appendiceal orifice and ileocecal valve. The                            colonoscopy was performed without difficulty. The                            patient tolerated the procedure well. The quality                            of the bowel preparation was excellent. The bowel                            preparation used was Miralax. The ileocecal valve,                            appendiceal orifice, and rectum were photographed. Scope In: 1:40:47 PM Scope Out: 1:54:16 PM Scope Withdrawal Time: 0 hours 11 minutes 40 seconds  Total Procedure Duration: 0 hours 13 minutes 29 seconds  Findings:                 The perianal and digital rectal examinations were                            normal. Pertinent negatives include normal prostate                            (size, shape, and consistency).                           A diminutive polyp was found in the transverse                            colon. The polyp was sessile. The polyp was removed                            with a cold snare. Resection was complete, but the  polyp tissue was not retrieved. Verification of                            patient identification for the specimen was done.                            Estimated blood loss was minimal.                           Scattered diverticula were found in the left colon.                           The exam was otherwise without abnormality on                            direct and retroflexion views. Complications:            No immediate complications. Estimated Blood Loss:     Estimated blood loss was minimal. Impression:               - One diminutive polyp in the transverse colon,                            removed with a cold snare. Complete resection.                            Polyp tissue not retrieved.                           - Diverticulosis in the left colon.                            - The examination was otherwise normal on direct                            and retroflexion views.                           - Personal history of colonic polyps. Recommendation:           - Patient has a contact number available for                            emergencies. The signs and symptoms of potential                            delayed complications were discussed with the                            patient. Return to normal activities tomorrow.                            Written discharge instructions were provided to the                            patient.                           -  Resume previous diet.                           - Continue present medications.                           - Resume Plavix (clopidogrel) at prior dose today.                           - Repeat colonoscopy in 5 years for surveillance. Gatha Mayer, MD 11/13/2016 1:58:54 PM This report has been signed electronically.

## 2016-11-13 NOTE — Patient Instructions (Addendum)
I found and removed one tiny polyp. I was not able to collect it but it was tiny and I am confident it was benign.  Your next routine colonoscopy should be in 5 years - 2023.  I appreciate the opportunity to care for you. Gatha Mayer, MD, FACG YOU HAD AN ENDOSCOPIC PROCEDURE TODAY AT [facility]:   Refer to the procedure report that was given to you for any specific questions about what was found during the examination.  If the procedure report does not answer your questions, please call your gastroenterologist to clarify.  If you requested that your care partner not be given the details of your procedure findings, then the procedure report has been included in a sealed envelope for you to review at your convenience later.  YOU SHOULD EXPECT: Some feelings of bloating in the abdomen. Passage of more gas than usual.  Walking can help get rid of the air that was put into your GI tract during the procedure and reduce the bloating. If you had a lower endoscopy (such as a colonoscopy or flexible sigmoidoscopy) you may notice spotting of blood in your stool or on the toilet paper. If you underwent a bowel prep for your procedure, you may not have a normal bowel movement for a few days.  Please Note:  You might notice some irritation and congestion in your nose or some drainage.  This is from the oxygen used during your procedure.  There is no need for concern and it should clear up in a day or so.  SYMPTOMS TO REPORT IMMEDIATELY:   Following lower endoscopy (colonoscopy or flexible sigmoidoscopy):  Excessive amounts of blood in the stool  Significant tenderness or worsening of abdominal pains  Swelling of the abdomen that is new, acute  Fever of 100F or higher   Following upper endoscopy (EGD)  Vomiting of blood or coffee ground material  New chest pain or pain under the shoulder blades  Painful or persistently difficult swallowing  New shortness of breath  Fever  of 100F or higher  Black, tarry-looking stools  For urgent or emergent issues, a gastroenterologist can be reached at any hour by calling [phone].   DIET:  We do recommend a small meal at first, but then you may proceed to your regular diet.  Drink plenty of fluids but you should avoid alcoholic beverages for 24 hours.  ACTIVITY:  You should plan to take it easy for the rest of today and you should NOT DRIVE or use heavy machinery until tomorrow (because of the sedation medicines used during the test).    FOLLOW UP: Our staff will call the number listed on your records the next business day following your procedure to check on you and address any questions or concerns that you may have regarding the information given to you following your procedure. If we do not reach you, we will leave a message.  However, if you are feeling well and you are not experiencing any problems, there is no need to return our call.  We will assume that you have returned to your regular daily activities without incident.  If any biopsies were taken you will be contacted by phone or by letter within the next 1-3 weeks.  Please call us at [phone] if you have not heard about the biopsies in 3 weeks.    SIGNATURES/CONFIDENTIALITY: You and/or your care partner have signed paperwork which will be entered into your electronic medical  record.  These signatures attest to the fact that that the information above on your After Visit Summary has been reviewed and is understood.  Full responsibility of the confidentiality of this discharge information lies with you and/or your care-partner.  Polyp and diverticulosis information given.  Resume Plavix today.

## 2016-11-16 ENCOUNTER — Telehealth: Payer: Self-pay | Admitting: *Deleted

## 2016-11-16 ENCOUNTER — Telehealth: Payer: Self-pay

## 2016-11-16 NOTE — Telephone Encounter (Signed)
  Follow up Call-  Call back number 11/13/2016  Post procedure Call Back phone  # [phone]  Permission to leave phone message Yes  Some recent data might be hidden    [facility]

## 2016-11-16 NOTE — Telephone Encounter (Signed)
Attempted to reach pt. With follow up call following endoscopic procedure 11/13/2016.  LM on pt. Ans. Machine.   Will try to reach pt. Again later today.

## 2016-11-17 ENCOUNTER — Encounter: Payer: Self-pay | Admitting: Internal Medicine

## 2016-12-01 ENCOUNTER — Other Ambulatory Visit: Payer: Medicare PPO

## 2016-12-08 ENCOUNTER — Other Ambulatory Visit: Payer: Medicare PPO

## 2017-01-20 ENCOUNTER — Ambulatory Visit (INDEPENDENT_AMBULATORY_CARE_PROVIDER_SITE_OTHER): Payer: Medicare PPO | Admitting: *Deleted

## 2017-01-20 DIAGNOSIS — Z23 Encounter for immunization: Secondary | ICD-10-CM

## 2017-02-14 ENCOUNTER — Other Ambulatory Visit: Payer: Self-pay | Admitting: Internal Medicine

## 2017-02-25 ENCOUNTER — Encounter: Payer: Self-pay | Admitting: Internal Medicine

## 2017-02-25 ENCOUNTER — Ambulatory Visit (INDEPENDENT_AMBULATORY_CARE_PROVIDER_SITE_OTHER): Payer: Medicare PPO | Admitting: Internal Medicine

## 2017-02-25 VITALS — BP 150/62 | HR 64 | Temp 98.2°F | Ht 69.0 in | Wt 172.8 lb

## 2017-02-25 DIAGNOSIS — I10 Essential (primary) hypertension: Secondary | ICD-10-CM

## 2017-02-25 DIAGNOSIS — R972 Elevated prostate specific antigen [PSA]: Secondary | ICD-10-CM

## 2017-02-25 NOTE — Patient Instructions (Signed)
Limit your sodium (Salt) intake    It is important that you exercise regularly, at least 20 minutes 3 to 4 times per week.  If you develop chest pain or shortness of breath seek  medical attention.  Cardiology follow-up as scheduled  Return in 6 months for follow-up

## 2017-02-25 NOTE — Progress Notes (Signed)
Subjective:    Patient ID: Ian Roman, male    DOB: [birth year], 71 y.o.   MRN: [id number]  HPI  71 year old patient who is seen today in follow-up.  He has a history of coronary artery disease which has been stable.  He has essential hypertension.  He was seen for his annual exam 6 months ago and PSA was elevated.  This has been elevated intermittently in the past. He feels well today and his cardiac status has been stable. He is followed by cardiology.  Past Medical History:  Diagnosis Date  . Coronary artery disease    Status post stenting x2 2007  . Hyperlipidemia   . Hypertension   . Personal history of colonic polyps   . Prostatitis   . Stroke ([place])    tia   . Vertigo      Social History   Socioeconomic History  . Marital status: Married    Spouse name: Not on file  . Number of children: Not on file  . Years of education: Not on file  . Highest education level: Not on file  Social Needs  . Financial resource strain: Not on file  . Food insecurity - worry: Not on file  . Food insecurity - inability: Not on file  . Transportation needs - medical: Not on file  . Transportation needs - non-medical: Not on file  Occupational History  . Not on file  Tobacco Use  . Smoking status: Never Smoker  . Smokeless tobacco: Never Used  Substance and Sexual Activity  . Alcohol use: Yes    Comment: once a month per pt  . Drug use: No  . Sexual activity: Not on file  Other Topics Concern  . Not on file  Social History Narrative  . Not on file    Past Surgical History:  Procedure Laterality Date  . COLONOSCOPY    . CORONARY ANGIOPLASTY WITH STENT PLACEMENT     x2   2007    Family History  Problem Relation Age of Onset  . Heart disease Mother   . Cancer Father        prostate ca  . Colon cancer Neg Hx     Allergies  Allergen Reactions  . Zocor [Simvastatin]     Intolerance    Current Outpatient Medications on File Prior to Visit  Medication Sig  Dispense Refill  . aspirin 81 MG tablet Take 81 mg by mouth daily.    . clopidogrel (PLAVIX) 75 MG tablet TAKE ONE TABLET BY MOUTH ONCE DAILY 90 tablet 1  . co-enzyme Q-10 30 MG capsule Take 30 mg by mouth daily.    . Flaxseed, Linseed, (FLAXSEED OIL) 1000 MG CAPS Take by mouth.    . fluticasone (FLONASE) 50 MCG/ACT nasal spray Place 2 sprays into both nostrils daily. 16 g 5  . Lycopene 10 MG CAPS Take 1 capsule by mouth daily.    . metoprolol succinate (TOPROL-XL) 25 MG 24 hr tablet TAKE 1 TABLET BY MOUTH ONCE DAILY 90 tablet 1  . Multiple Vitamin tablet Take by mouth.    . NON FORMULARY CPAP machine     . rosuvastatin (CRESTOR) 10 MG tablet Take 0.5 tablets (5 mg total) by mouth daily. 45 tablet 0  . sildenafil (VIAGRA) 100 MG tablet TAKE ONE TABLET BY MOUTH AS NEEDED FOR  ERECTILE  DYSFUNCTION 20 tablet 2   Current Facility-Administered Medications on File Prior to Visit  Medication Dose Route Frequency Provider Last Rate  Last Dose  . 0.9 %  sodium chloride infusion  500 mL Intravenous Continuous Gatha Mayer, MD        BP (!) 150/62 (BP Location: Left Arm, Patient Position: Sitting, Cuff Size: Normal)   Pulse 64   Temp 98.2 F (36.8 C) (Oral)   Ht 5\' 9"  (1.753 m)   Wt 172 lb 12.8 oz (78.4 kg)   SpO2 98%   BMI 25.52 kg/m     Review of Systems  Constitutional: Negative for appetite change, chills, fatigue and fever.  HENT: Negative for congestion, dental problem, ear pain, hearing loss, sore throat, tinnitus, trouble swallowing and voice change.   Eyes: Negative for pain, discharge and visual disturbance.  Respiratory: Negative for cough, chest tightness, wheezing and stridor.   Cardiovascular: Negative for chest pain, palpitations and leg swelling.  Gastrointestinal: Negative for abdominal distention, abdominal pain, blood in stool, constipation, diarrhea, nausea and vomiting.  Genitourinary: Negative for difficulty urinating, discharge, flank pain, genital sores, hematuria  and urgency.  Musculoskeletal: Negative for arthralgias, back pain, gait problem, joint swelling, myalgias and neck stiffness.  Skin: Negative for rash.  Neurological: Negative for dizziness, syncope, speech difficulty, weakness, numbness and headaches.  Hematological: Negative for adenopathy. Does not bruise/bleed easily.  Psychiatric/Behavioral: Negative for behavioral problems and dysphoric mood. The patient is not nervous/anxious.        Objective:   Physical Exam  Constitutional: He is oriented to person, place, and time. He appears well-developed.  Blood pressure 134/72  HENT:  Head: Normocephalic.  Right Ear: External ear normal.  Left Ear: External ear normal.  Eyes: Conjunctivae and EOM are normal.  Neck: Normal range of motion.  Cardiovascular: Normal rate and normal heart sounds.  Pulmonary/Chest: Breath sounds normal.  Abdominal: Bowel sounds are normal.  Musculoskeletal: Normal range of motion. He exhibits no edema or tenderness.  Neurological: He is alert and oriented to person, place, and time.  Psychiatric: He has a normal mood and affect. His behavior is normal.          Assessment & Plan:   Hypertension Elevated PSA.  We will repeat with free PSA.  Consider urology referral Coronary artery disease   CPX 6 months Ian Roman Ian Roman

## 2017-02-26 ENCOUNTER — Ambulatory Visit: Payer: Medicare PPO | Admitting: Internal Medicine

## 2017-02-26 LAB — PSA, TOTAL AND FREE
PSA, % Free: 13 % — ABNORMAL LOW
PSA, Free: 0.8 ng/mL
PSA, Total: 6 ng/mL — ABNORMAL HIGH

## 2017-03-03 ENCOUNTER — Encounter: Payer: Self-pay | Admitting: Internal Medicine

## 2017-03-24 ENCOUNTER — Other Ambulatory Visit: Payer: Self-pay | Admitting: Internal Medicine

## 2017-04-23 ENCOUNTER — Other Ambulatory Visit: Payer: Self-pay | Admitting: Internal Medicine

## 2017-07-22 ENCOUNTER — Ambulatory Visit (INDEPENDENT_AMBULATORY_CARE_PROVIDER_SITE_OTHER): Payer: Medicare PPO | Admitting: Internal Medicine

## 2017-07-22 ENCOUNTER — Encounter: Payer: Self-pay | Admitting: Internal Medicine

## 2017-07-22 VITALS — BP 120/74

## 2017-07-22 DIAGNOSIS — J301 Allergic rhinitis due to pollen: Secondary | ICD-10-CM

## 2017-07-22 DIAGNOSIS — I10 Essential (primary) hypertension: Secondary | ICD-10-CM | POA: Diagnosis not present

## 2017-07-22 MED ORDER — HYDROCODONE-HOMATROPINE 5-1.5 MG/5ML PO SYRP: 5.0000 mL | ORAL_SOLUTION | Freq: Four times a day (QID) | ORAL | 0 refills | Status: AC | PRN

## 2017-07-22 NOTE — Patient Instructions (Addendum)
Limit your sodium (Salt) intake  Please check your blood pressure on a regular basis.  If it is consistently greater than 150/90, please make an office appointment.  Hydrate and Humidify  Drink enough water to keep your urine clear or pale yellow. Staying hydrated will help to thin your mucus.  Use a cool mist humidifier to keep the humidity level in your home above 50%.  Inhale steam for 10-15 minutes, 3-4 times a day or as told by your health care provider. You can do this in the bathroom while a hot shower is running.  Limit your exposure to cool or dry air. Rest  Rest as much as possible.

## 2017-07-22 NOTE — Progress Notes (Signed)
Subjective:    Patient ID: Ian Roman, male    DOB: 30-Jun-1945, 72 y.o.   MRN: [id number]  HPI  72 year old patient who has a history of essential hypertension.  He also has a history of allergic rhinitis and has been on maintenance fluticasone nasal spray as well as frequent use of Claritin. For the past week he has had increasing cough.  Cough is worse at night.  It is described as dry and nonproductive but interferes with his sleep.  He has added Mucinex DM to his regimen.  Past Medical History:  Diagnosis Date  . Coronary artery disease    Status post stenting x2 2007  . Hyperlipidemia   . Hypertension   . Personal history of colonic polyps   . Prostatitis   . Stroke ([place])    tia   . Vertigo      Social History   Socioeconomic History  . Marital status: Married    Spouse name: Not on file  . Number of children: Not on file  . Years of education: Not on file  . Highest education level: Not on file  Occupational History  . Not on file  Social Needs  . Financial resource strain: Not on file  . Food insecurity:    Worry: Not on file    Inability: Not on file  . Transportation needs:    Medical: Not on file    Non-medical: Not on file  Tobacco Use  . Smoking status: Never Smoker  . Smokeless tobacco: Never Used  Substance and Sexual Activity  . Alcohol use: Yes    Comment: once a month per pt  . Drug use: No  . Sexual activity: Not on file  Lifestyle  . Physical activity:    Days per week: Not on file    Minutes per session: Not on file  . Stress: Not on file  Relationships  . Social connections:    Talks on phone: Not on file    Gets together: Not on file    Attends religious service: Not on file    Active member of club or organization: Not on file    Attends meetings of clubs or organizations: Not on file    Relationship status: Not on file  . Intimate partner violence:    Fear of current or ex partner: Not on file    Emotionally abused: Not on  file    Physically abused: Not on file    Forced sexual activity: Not on file  Other Topics Concern  . Not on file  Social History Narrative  . Not on file    Past Surgical History:  Procedure Laterality Date  . COLONOSCOPY    . CORONARY ANGIOPLASTY WITH STENT PLACEMENT     x2   2007    Family History  Problem Relation Age of Onset  . Heart disease Mother   . Cancer Father        prostate ca  . Colon cancer Neg Hx     Allergies  Allergen Reactions  . Zocor [Simvastatin]     Intolerance    Current Outpatient Medications on File Prior to Visit  Medication Sig Dispense Refill  . aspirin 81 MG tablet Take 81 mg by mouth daily.    . clopidogrel (PLAVIX) 75 MG tablet TAKE 1 TABLET BY MOUTH ONCE DAILY 90 tablet 1  . co-enzyme Q-10 30 MG capsule Take 30 mg by mouth daily.    . Flaxseed, Linseed, (  FLAXSEED OIL) 1000 MG CAPS Take by mouth.    . fluticasone (FLONASE) 50 MCG/ACT nasal spray Place 2 sprays into both nostrils daily. 16 g 5  . Lycopene 10 MG CAPS Take 1 capsule by mouth daily.    . metoprolol succinate (TOPROL-XL) 25 MG 24 hr tablet TAKE 1 TABLET BY MOUTH ONCE DAILY 90 tablet 1  . Multiple Vitamin tablet Take by mouth.    . NON FORMULARY CPAP machine     . rosuvastatin (CRESTOR) 10 MG tablet Take 0.5 tablets (5 mg total) by mouth daily. 45 tablet 0  . rosuvastatin (CRESTOR) 10 MG tablet TAKE ONE-HALF TABLET BY MOUTH ONCE DAILY 30 tablet 5  . sildenafil (VIAGRA) 100 MG tablet TAKE ONE TABLET BY MOUTH AS NEEDED FOR  ERECTILE  DYSFUNCTION 20 tablet 2   Current Facility-Administered Medications on File Prior to Visit  Medication Dose Route Frequency Provider Last Rate Last Dose  . 0.9 %  sodium chloride infusion  500 mL Intravenous Continuous Gatha Mayer, MD        BP 120/74     Review of Systems  Constitutional: Negative for appetite change, chills, fatigue and fever.  HENT: Negative for congestion, dental problem, ear pain, hearing loss, sore throat,  tinnitus, trouble swallowing and voice change.   Eyes: Negative for pain, discharge and visual disturbance.  Respiratory: Positive for cough. Negative for chest tightness, wheezing and stridor.   Cardiovascular: Negative for chest pain, palpitations and leg swelling.  Gastrointestinal: Negative for abdominal distention, abdominal pain, blood in stool, constipation, diarrhea, nausea and vomiting.  Genitourinary: Negative for difficulty urinating, discharge, flank pain, genital sores, hematuria and urgency.  Musculoskeletal: Negative for arthralgias, back pain, gait problem, joint swelling, myalgias and neck stiffness.  Skin: Negative for rash.  Neurological: Negative for dizziness, syncope, speech difficulty, weakness, numbness and headaches.  Hematological: Negative for adenopathy. Does not bruise/bleed easily.  Psychiatric/Behavioral: Negative for behavioral problems and dysphoric mood. The patient is not nervous/anxious.        Objective:   Physical Exam  Constitutional: He is oriented to person, place, and time. He appears well-developed.  HENT:  Head: Normocephalic.  Right Ear: External ear normal.  Left Ear: External ear normal.  Eyes: Conjunctivae and EOM are normal.  Neck: Normal range of motion.  Cardiovascular: Normal rate and normal heart sounds.  Pulmonary/Chest: Breath sounds normal. No stridor. No respiratory distress. He has no wheezes. He has no rales.  Abdominal: Bowel sounds are normal.  Musculoskeletal: Normal range of motion. He exhibits no edema or tenderness.  Neurological: He is alert and oriented to person, place, and time.  Psychiatric: He has a normal mood and affect. His behavior is normal.          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  Will treat symptomatically Essential hypertension stable Elevated PSA.  Patient will consider urology referral  Nyoka Cowden

## 2017-07-28 ENCOUNTER — Ambulatory Visit (INDEPENDENT_AMBULATORY_CARE_PROVIDER_SITE_OTHER): Payer: Medicare PPO | Admitting: Internal Medicine

## 2017-07-28 ENCOUNTER — Ambulatory Visit: Payer: Medicare PPO | Admitting: Adult Health

## 2017-07-28 ENCOUNTER — Encounter: Payer: Self-pay | Admitting: Internal Medicine

## 2017-07-28 VITALS — BP 130/82 | HR 69 | Temp 98.4°F | Wt 161.0 lb

## 2017-07-28 DIAGNOSIS — I2510 Atherosclerotic heart disease of native coronary artery without angina pectoris: Secondary | ICD-10-CM

## 2017-07-28 NOTE — Progress Notes (Signed)
Subjective:    Patient ID: Ian Roman, male    DOB: [birth year], 72 y.o.   MRN: [id number]  HPI 72 year old patient who has a history of coronary artery disease.  He is status post stenting x2 in 2007.  He is followed by [facility] cardiology.  He was seen here recently for a URI and due to cough and congestion has been using periodically a hydrocodone-based antitussives.  Yesterday he was walking in the mall for approximately 1 hour.  He became flushed diaphoretic and had a sensation of " needing air".  He then had several episodes of vomiting over 15 minutes.  He denied any chest pain. He had no further issues yesterday but last night decided that the symptoms should prompt an evaluation.  He has been followed by Dr. Maudie Mercury a cardiologist in [place] but apparently no recent stress testing has been performed.  He has been on  DAPT statin therapy and metoprolol.  No nitroglycerin use.  Past Medical History:  Diagnosis Date  . Coronary artery disease    Status post stenting x2 2007  . Hyperlipidemia   . Hypertension   . Personal history of colonic polyps   . Prostatitis   . Stroke ([place])    tia   . Vertigo      Social History   Socioeconomic History  . Marital status: Married    Spouse name: Not on file  . Number of children: Not on file  . Years of education: Not on file  . Highest education level: Not on file  Occupational History  . Not on file  Social Needs  . Financial resource strain: Not on file  . Food insecurity:    Worry: Not on file    Inability: Not on file  . Transportation needs:    Medical: Not on file    Non-medical: Not on file  Tobacco Use  . Smoking status: Never Smoker  . Smokeless tobacco: Never Used  Substance and Sexual Activity  . Alcohol use: Yes    Comment: once a month per pt  . Drug use: No  . Sexual activity: Not on file  Lifestyle  . Physical activity:    Days per week: Not on file    Minutes per session: Not on file  . Stress: Not on file   Relationships  . Social connections:    Talks on phone: Not on file    Gets together: Not on file    Attends religious service: Not on file    Active member of club or organization: Not on file    Attends meetings of clubs or organizations: Not on file    Relationship status: Not on file  . Intimate partner violence:    Fear of current or ex partner: Not on file    Emotionally abused: Not on file    Physically abused: Not on file    Forced sexual activity: Not on file  Other Topics Concern  . Not on file  Social History Narrative  . Not on file    Past Surgical History:  Procedure Laterality Date  . COLONOSCOPY    . CORONARY ANGIOPLASTY WITH STENT PLACEMENT     x2   2007    Family History  Problem Relation Age of Onset  . Heart disease Mother   . Cancer Father        prostate ca  . Colon cancer Neg Hx     Allergies  Allergen Reactions  . Zocor [  Simvastatin]     Intolerance    Current Outpatient Medications on File Prior to Visit  Medication Sig Dispense Refill  . aspirin 81 MG tablet Take 81 mg by mouth daily.    . clopidogrel (PLAVIX) 75 MG tablet TAKE 1 TABLET BY MOUTH ONCE DAILY 90 tablet 1  . co-enzyme Q-10 30 MG capsule Take 30 mg by mouth daily.    . Flaxseed, Linseed, (FLAXSEED OIL) 1000 MG CAPS Take by mouth.    . fluticasone (FLONASE) 50 MCG/ACT nasal spray Place 2 sprays into both nostrils daily. 16 g 5  . Lycopene 10 MG CAPS Take 1 capsule by mouth daily.    . metoprolol succinate (TOPROL-XL) 25 MG 24 hr tablet TAKE 1 TABLET BY MOUTH ONCE DAILY 90 tablet 1  . Multiple Vitamin tablet Take by mouth.    . NON FORMULARY CPAP machine     . rosuvastatin (CRESTOR) 10 MG tablet Take 0.5 tablets (5 mg total) by mouth daily. 45 tablet 0  . rosuvastatin (CRESTOR) 10 MG tablet TAKE ONE-HALF TABLET BY MOUTH ONCE DAILY 30 tablet 5  . sildenafil (VIAGRA) 100 MG tablet TAKE ONE TABLET BY MOUTH AS NEEDED FOR  ERECTILE  DYSFUNCTION 20 tablet 2   Current  Facility-Administered Medications on File Prior to Visit  Medication Dose Route Frequency Provider Last Rate Last Dose  . 0.9 %  sodium chloride infusion  500 mL Intravenous Continuous Gatha Mayer, MD        BP 130/82 (BP Location: Right Arm, Patient Position: Sitting, Cuff Size: Large)   Pulse 69   Temp 98.4 F (36.9 C) (Oral)   Wt 161 lb (73 kg)   SpO2 98%   BMI 23.78 kg/m      Review of Systems  Constitutional: Positive for diaphoresis and fatigue. Negative for appetite change, chills and fever.  HENT: Negative for congestion, dental problem, ear pain, hearing loss, sore throat, tinnitus, trouble swallowing and voice change.   Eyes: Negative for pain, discharge and visual disturbance.  Respiratory: Positive for shortness of breath. Negative for cough, chest tightness, wheezing and stridor.   Cardiovascular: Negative for chest pain, palpitations and leg swelling.  Gastrointestinal: Positive for nausea and vomiting. Negative for abdominal distention, abdominal pain, blood in stool, constipation and diarrhea.  Genitourinary: Negative for difficulty urinating, discharge, flank pain, genital sores, hematuria and urgency.  Musculoskeletal: Negative for arthralgias, back pain, gait problem, joint swelling, myalgias and neck stiffness.  Skin: Negative for rash.  Neurological: Negative for dizziness, syncope, speech difficulty, weakness, numbness and headaches.  Hematological: Negative for adenopathy. Does not bruise/bleed easily.  Psychiatric/Behavioral: Negative for behavioral problems and dysphoric mood. The patient is not nervous/anxious.        Objective:   Physical Exam  Constitutional: He is oriented to person, place, and time. He appears well-developed.  Blood pressure 120/74  HENT:  Head: Normocephalic.  Right Ear: External ear normal.  Left Ear: External ear normal.  Eyes: Conjunctivae and EOM are normal.  Neck: Normal range of motion.  Cardiovascular: Normal rate  and normal heart sounds.  Pulmonary/Chest: Breath sounds normal.  Abdominal: Bowel sounds are normal.  Musculoskeletal: Normal range of motion. He exhibits no edema or tenderness.  Neurological: He is alert and oriented to person, place, and time.  Psychiatric: He has a normal mood and affect. His behavior is normal.          Assessment & Plan:   Coronary artery disease  Episode yesterday of diaphoresis nausea  following exertion in the setting of recent URI and hydrocodone use.  Will review an EKG.  If this is normal we will schedule local cardiology evaluation to include myocardial perfusion imaging   [facility]

## 2017-07-28 NOTE — Patient Instructions (Signed)
Cardiology consultation as discussed  Report any new or worsening symptoms  Discontinue hydrocodone  Avoid any strenuous activities until cardiology evaluation complete

## 2017-07-29 ENCOUNTER — Encounter: Payer: Self-pay | Admitting: Internal Medicine

## 2017-08-02 ENCOUNTER — Ambulatory Visit (INDEPENDENT_AMBULATORY_CARE_PROVIDER_SITE_OTHER): Payer: Medicare PPO | Admitting: Interventional Cardiology

## 2017-08-02 ENCOUNTER — Encounter: Payer: Self-pay | Admitting: Interventional Cardiology

## 2017-08-02 VITALS — BP 142/80 | HR 68 | Ht 69.0 in | Wt 162.4 lb

## 2017-08-02 DIAGNOSIS — I2510 Atherosclerotic heart disease of native coronary artery without angina pectoris: Secondary | ICD-10-CM | POA: Diagnosis not present

## 2017-08-02 DIAGNOSIS — G4733 Obstructive sleep apnea (adult) (pediatric): Secondary | ICD-10-CM | POA: Diagnosis not present

## 2017-08-02 DIAGNOSIS — E785 Hyperlipidemia, unspecified: Secondary | ICD-10-CM | POA: Diagnosis not present

## 2017-08-02 DIAGNOSIS — N529 Male erectile dysfunction, unspecified: Secondary | ICD-10-CM

## 2017-08-02 NOTE — Progress Notes (Signed)
Cardiology Office Note   Date:  08/02/2017   ID:  Ian Roman, Ian Roman [birth year], MRN [id number]  PCP:  Marletta Lor, MD    No chief complaint on file.  CAD  Wt Readings from Last 3 Encounters:  08/02/17 162 lb 6.4 oz (73.7 kg)  07/28/17 161 lb (73 kg)  02/25/17 172 lb 12.8 oz (78.4 kg)       History of Present Illness: Ian Roman is a 72 y.o. male who is being seen today for the evaluation of CAD at the request of Marletta Lor, MD.  He was previously followed in [place] for CAD.  He had 2 stents placed at [facility] in 2007.  He was working in [place] at the time.  He lives in [place] and retired in 2012.  He wanted followup in [place].  In 2007, he was walking to get his car.  He felt that everything was getting slow.  He had no pain, but after 1 minute, everything came back to normal.  He had a stress test and then cath.  He had 2 stents placed.  A Vision stent, 3.5 x 12 was placed in the LAD. 2.5 x 18 mm Cypher stent placed in the RCA.  Cardiology office note from [facility] ( 04/17/14- Dr. Thad Ranger) states "2 Cypher stents in each artery."  In May 2019, he was walking in the mall with his wife and he felt warm while he was walking.  He felt like he needed air.  He then threw up.  He had taken some hydrocodone cough syrup just before walking. THis prompted him to ask for a cardiologist here.    Denies : Chest pain. Dizziness. Leg edema. Nitroglycerin use. Orthopnea. Palpitations. Paroxysmal nocturnal dyspnea. Shortness of breath. Syncope.   No recent stress test.   Past Medical History:  Diagnosis Date  . Coronary artery disease    Status post stenting x2 2007  . Hyperlipidemia   . Hypertension   . Personal history of colonic polyps   . Prostatitis   . Stroke ([place])    tia   . Vertigo     Past Surgical History:  Procedure Laterality Date  . COLONOSCOPY    . CORONARY ANGIOPLASTY WITH STENT PLACEMENT     x2   2007     Current Outpatient Medications    Medication Sig Dispense Refill  . aspirin 81 MG tablet Take 81 mg by mouth daily.    . clopidogrel (PLAVIX) 75 MG tablet TAKE 1 TABLET BY MOUTH ONCE DAILY 90 tablet 1  . co-enzyme Q-10 30 MG capsule Take 30 mg by mouth daily.    . Flaxseed, Linseed, (FLAXSEED OIL) 1000 MG CAPS Take by mouth.    . fluticasone (FLONASE) 50 MCG/ACT nasal spray Place 2 sprays into both nostrils daily. 16 g 5  . Lycopene 10 MG CAPS Take 1 capsule by mouth daily.    . metoprolol succinate (TOPROL-XL) 25 MG 24 hr tablet TAKE 1 TABLET BY MOUTH ONCE DAILY 90 tablet 1  . Multiple Vitamin tablet Take by mouth.    . NON FORMULARY CPAP machine     . rosuvastatin (CRESTOR) 10 MG tablet Take 0.5 tablets (5 mg total) by mouth daily. 45 tablet 0  . sildenafil (VIAGRA) 100 MG tablet TAKE ONE TABLET BY MOUTH AS NEEDED FOR  ERECTILE  DYSFUNCTION 20 tablet 2   Current Facility-Administered Medications  Medication Dose Route Frequency Provider Last Rate Last Dose  .  0.9 %  sodium chloride infusion  500 mL Intravenous Continuous Gatha Mayer, MD        Allergies:   Zocor [simvastatin]    Social History:  The patient  reports that he has never smoked. He has never used smokeless tobacco. He reports that he drinks alcohol. He reports that he does not use drugs.   Family History:  The patient's family history includes Cancer in his father; Heart disease in his mother.    ROS:  Please see the history of present illness.   Otherwise, review of systems are positive for recent episode of vomiting.   All other systems are reviewed and negative.    PHYSICAL EXAM: VS:  BP (!) 142/80 (BP Location: Left Arm, Patient Position: Sitting, Cuff Size: Normal)   Pulse 68   Ht 5\' 9"  (1.753 m)   Wt 162 lb 6.4 oz (73.7 kg)   SpO2 97%   BMI 23.98 kg/m  , BMI Body mass index is 23.98 kg/m. GEN: Well nourished, well developed, in no acute distress  HEENT: normal  Neck: no JVD, carotid bruits, or masses Cardiac: RRR; no murmurs, rubs,  or gallops,no edema  Respiratory:  clear to auscultation bilaterally, normal work of breathing GI: soft, nontender, nondistended, + BS MS: no deformity or atrophy  Skin: warm and dry, no rash Neuro:  Strength and sensation are intact Psych: euthymic mood, full affect   EKG:   The ekg ordered on 07/28/17 demonstrates normal ECG   Recent Labs: 08/14/2016: ALT 10; BUN 15; Creatinine, Ser 1.15; Hemoglobin 12.8; Platelets 276.0; Potassium 3.8; Sodium 137   Lipid Panel    Component Value Date/Time   CHOL 135 08/14/2016 0841   TRIG 69.0 08/14/2016 0841   HDL 35.80 (L) 08/14/2016 0841   CHOLHDL 4 08/14/2016 0841   VLDL 13.8 08/14/2016 0841   LDLCALC 86 08/14/2016 0841     Other studies Reviewed: Additional studies/ records that were reviewed today with results demonstrating: stent cards reviewed.   ASSESSMENT AND PLAN:  1. CAD: Atypical sx the first time he had ischemia.  Plan for exercise nuclear stress test to evaluate for ischemia.  He thinks his nausea was related to the hydrocodone.  12 years post stents, need to eval or CAD.  Continue DAPT.   2. Hyperlipidemia: Followed with his primary care doctor.  Continue Crestor.  LDL 86 in May 2018. 3. Erectile dysfunction: Using Viagra.  He has not been using any sublingual nitroglycerin. 4. OSA: Needs referral to get Rx for his known sleep apnea, diagnosed years ago.   Current medicines are reviewed at length with the patient today.  The patient concerns regarding his medicines were addressed.  The following changes have been made:  No change  Labs/ tests ordered today include:  No orders of the defined types were placed in this encounter.   Recommend 150 minutes/week of aerobic exercise Low fat, low carb, high fiber diet recommended  Disposition:   FU for stress test   Signed, Larae Grooms, MD  08/02/2017 3:15 PM    [facility] [address], [place], [place]  [address] Phone: [phone]; Fax:  [phone]

## 2017-08-02 NOTE — Patient Instructions (Addendum)
Medication Instructions:  Your physician recommends that you continue on your current medications as directed. Please refer to the Current Medication list given to you today.   Labwork: None ordered  Testing/Procedures: Your physician has requested that you have a exercise myoview. For further information please visit [email or web address]. Please follow instruction sheet, as given.  Follow-Up: You have been referred to Dr. Radford Pax or Dr. Claiborne Billings for Sleep Apnea Management   Your physician wants you to follow-up in: 6 months with Dr. Irish Lack. You will receive a reminder letter in the mail two months in advance. If you don't receive a letter, please call our office to schedule the follow-up appointment.   Any Other Special Instructions Will Be Listed Below (If Applicable).     If you need a refill on your cardiac medications before your next appointment, please call your pharmacy.

## 2017-08-03 ENCOUNTER — Telehealth (HOSPITAL_COMMUNITY): Payer: Self-pay | Admitting: *Deleted

## 2017-08-03 ENCOUNTER — Telehealth: Payer: Self-pay | Admitting: *Deleted

## 2017-08-03 DIAGNOSIS — G4733 Obstructive sleep apnea (adult) (pediatric): Secondary | ICD-10-CM

## 2017-08-03 NOTE — Telephone Encounter (Signed)
-----   Message from Earnestine Mealing sent at 08/02/2017  4:05 PM EDT ----- Regarding: Referral for Sleep Apnea Management Hi Ian Roman, This pt had a sleep study in 2007 at [facility] and he has not used his CPAP machine for at least 5 years.  Just checking to see if he will need a new sleep study before being scheduled with Dr Radford Pax.  Thanks

## 2017-08-03 NOTE — Telephone Encounter (Signed)
Either his primary cardiologist can order a split night sleep study and then I see him afterwards or he will need to see me before his study

## 2017-08-03 NOTE — Telephone Encounter (Signed)
Patient given detailed instructions per Myocardial Perfusion Study Information Sheet for the test on 08/06/17 at 0815. Patient notified to arrive 15 minutes early and that it is imperative to arrive on time for appointment to keep from having the test rescheduled.  If you need to cancel or reschedule your appointment, please call the office within 24 hours of your appointment. . Patient verbalized understanding.Ian Roman, Ranae Palms

## 2017-08-06 ENCOUNTER — Ambulatory Visit (HOSPITAL_COMMUNITY): Payer: Medicare PPO | Attending: Cardiology

## 2017-08-06 DIAGNOSIS — Z8673 Personal history of transient ischemic attack (TIA), and cerebral infarction without residual deficits: Secondary | ICD-10-CM | POA: Insufficient documentation

## 2017-08-06 DIAGNOSIS — I2510 Atherosclerotic heart disease of native coronary artery without angina pectoris: Secondary | ICD-10-CM | POA: Diagnosis not present

## 2017-08-06 DIAGNOSIS — I10 Essential (primary) hypertension: Secondary | ICD-10-CM | POA: Insufficient documentation

## 2017-08-06 LAB — MYOCARDIAL PERFUSION IMAGING
CHL CUP MPHR: 149 {beats}/min
CSEPEW: 6.7 METS
Exercise duration (min): 4 min
Exercise duration (sec): 45 s
LHR: 0.27
LVDIAVOL: 92 mL (ref 62–150)
LVSYSVOL: 43 mL
NUC STRESS TID: 0.93
Peak HR: 150 {beats}/min
Percent HR: 100 %
Rest HR: 83 {beats}/min
SDS: 1
SRS: 1
SSS: 2

## 2017-08-06 MED ORDER — TECHNETIUM TC 99M TETROFOSMIN IV KIT
30.8000 | PACK | Freq: Once | INTRAVENOUS | Status: AC | PRN
  Administered 2017-08-06: 30.8 via INTRAVENOUS
  Filled 2017-08-06: qty 31

## 2017-08-06 MED ORDER — TECHNETIUM TC 99M TETROFOSMIN IV KIT
10.3000 | PACK | Freq: Once | INTRAVENOUS | Status: AC | PRN
  Administered 2017-08-06: 10.3 via INTRAVENOUS
  Filled 2017-08-06: qty 11

## 2017-08-06 NOTE — Telephone Encounter (Signed)
Can Dr Irish Lack order him a split night please

## 2017-08-06 NOTE — Telephone Encounter (Signed)
Ok to order split night.

## 2017-08-06 NOTE — Addendum Note (Signed)
Addended by: Freada Bergeron on: 08/06/2017 06:06 PM   Modules accepted: Orders

## 2017-08-06 NOTE — Telephone Encounter (Signed)
SENT TO PRE CERT 

## 2017-08-09 ENCOUNTER — Telehealth: Payer: Self-pay

## 2017-08-09 NOTE — Telephone Encounter (Signed)
Spoke with patient earlier and made him aware of his stress test results. Made patient aware that JV will plan for f/u in 6 months and to let us know if his symptoms change or worsen. Also made patient aware that a sleep study has been ordered and has been sent to the pre-cert department. Made patient aware that once approved he will be contacted about scheduling the appointment. ESS=14. Patient verbalized understanding and thanked me for the call.

## 2017-08-09 NOTE — Telephone Encounter (Signed)
-----   Message from Jettie Booze, MD sent at 08/09/2017  7:39 AM EDT ----- Negative stress test

## 2017-08-11 NOTE — Telephone Encounter (Addendum)
Sleep study received will fax to [facility]) [facility] and scan into patients chart.Marland Kitchen

## 2017-08-17 ENCOUNTER — Telehealth: Payer: Self-pay | Admitting: *Deleted

## 2017-08-17 NOTE — Telephone Encounter (Signed)
-----   Message from Freada Bergeron, [place] sent at 08/06/2017  6:03 PM EDT ----- Regarding: PRECERT Ok to order split night.

## 2017-08-17 NOTE — Telephone Encounter (Signed)
Pre cert sent to [facility] for in lab split night sleep study.

## 2017-08-18 ENCOUNTER — Telehealth: Payer: Self-pay | Admitting: *Deleted

## 2017-08-18 NOTE — Telephone Encounter (Addendum)
Patient is scheduled for lab study on 09/26/17.Marland Kitchen Patient understands his sleep study will be done at [facility] sleep lab. Patient understands he will receive a sleep packet in a week or so. Patient understands to call if he does not receive the sleep packet in a timely manner. Patient agrees with treatment and thanked me for call.

## 2017-08-18 NOTE — Telephone Encounter (Signed)
-----   Message from Freada Bergeron, [place] sent at 08/06/2017  6:03 PM EDT ----- Regarding: PRECERT Ok to order split night.

## 2017-08-18 NOTE — Telephone Encounter (Signed)
Staff message sent to [employer] received. Okay to schedule sleep study.

## 2017-08-20 ENCOUNTER — Other Ambulatory Visit: Payer: Self-pay

## 2017-08-20 MED ORDER — ROSUVASTATIN CALCIUM 10 MG PO TABS: 5.0000 mg | ORAL_TABLET | Freq: Every day | ORAL | 0 refills | Status: DC

## 2017-08-23 ENCOUNTER — Ambulatory Visit (INDEPENDENT_AMBULATORY_CARE_PROVIDER_SITE_OTHER): Payer: Medicare PPO | Admitting: Internal Medicine

## 2017-08-23 ENCOUNTER — Encounter: Payer: Self-pay | Admitting: Internal Medicine

## 2017-08-23 VITALS — BP 130/78 | HR 54 | Temp 98.0°F | Ht 68.25 in | Wt 161.0 lb

## 2017-08-23 DIAGNOSIS — N400 Enlarged prostate without lower urinary tract symptoms: Secondary | ICD-10-CM

## 2017-08-23 DIAGNOSIS — R972 Elevated prostate specific antigen [PSA]: Secondary | ICD-10-CM

## 2017-08-23 DIAGNOSIS — I2510 Atherosclerotic heart disease of native coronary artery without angina pectoris: Secondary | ICD-10-CM

## 2017-08-23 DIAGNOSIS — K2100 Gastro-esophageal reflux disease with esophagitis, without bleeding: Secondary | ICD-10-CM

## 2017-08-23 DIAGNOSIS — Z860100 Personal history of colon polyps, unspecified: Secondary | ICD-10-CM

## 2017-08-23 DIAGNOSIS — I10 Essential (primary) hypertension: Secondary | ICD-10-CM

## 2017-08-23 DIAGNOSIS — Z0000 Encounter for general adult medical examination without abnormal findings: Secondary | ICD-10-CM | POA: Diagnosis not present

## 2017-08-23 DIAGNOSIS — E782 Mixed hyperlipidemia: Secondary | ICD-10-CM | POA: Diagnosis not present

## 2017-08-23 LAB — CBC WITH DIFFERENTIAL/PLATELET
BASOS ABS: 0 10*3/uL (ref 0.0–0.1)
Basophils Relative: 0.5 % (ref 0.0–3.0)
EOS PCT: 1.9 % (ref 0.0–5.0)
Eosinophils Absolute: 0.1 10*3/uL (ref 0.0–0.7)
HCT: 38 % — ABNORMAL LOW (ref 39.0–52.0)
HEMOGLOBIN: 13 g/dL (ref 13.0–17.0)
LYMPHS ABS: 1 10*3/uL (ref 0.7–4.0)
Lymphocytes Relative: 19.9 % (ref 12.0–46.0)
MCHC: 34.3 g/dL (ref 30.0–36.0)
MCV: 97.8 fl (ref 78.0–100.0)
MONO ABS: 0.5 10*3/uL (ref 0.1–1.0)
MONOS PCT: 9.8 % (ref 3.0–12.0)
NEUTROS PCT: 67.9 % (ref 43.0–77.0)
Neutro Abs: 3.4 10*3/uL (ref 1.4–7.7)
Platelets: 176 10*3/uL (ref 150.0–400.0)
RBC: 3.89 Mil/uL — ABNORMAL LOW (ref 4.22–5.81)
RDW: 12.9 % (ref 11.5–15.5)
WBC: 5 10*3/uL (ref 4.0–10.5)

## 2017-08-23 LAB — COMPREHENSIVE METABOLIC PANEL
ALBUMIN: 4.5 g/dL (ref 3.5–5.2)
ALK PHOS: 66 U/L (ref 39–117)
ALT: 10 U/L (ref 0–53)
AST: 16 U/L (ref 0–37)
BUN: 19 mg/dL (ref 6–23)
CHLORIDE: 102 meq/L (ref 96–112)
CO2: 29 mEq/L (ref 19–32)
CREATININE: 1.1 mg/dL (ref 0.40–1.50)
Calcium: 9.6 mg/dL (ref 8.4–10.5)
GFR: 84.62 mL/min (ref >60.00)
Glucose, Bld: 106 mg/dL — ABNORMAL HIGH (ref 70–99)
POTASSIUM: 4.2 meq/L (ref 3.5–5.1)
Sodium: 138 mEq/L (ref 135–145)
TOTAL PROTEIN: 7.4 g/dL (ref 6.0–8.3)
Total Bilirubin: 1.5 mg/dL — ABNORMAL HIGH (ref 0.2–1.2)

## 2017-08-23 LAB — LIPID PANEL
CHOLESTEROL: 144 mg/dL (ref 0–200)
HDL: 41.7 mg/dL (ref >39.00)
LDL CALC: 87 mg/dL (ref 0–99)
NonHDL: 101.87
TRIGLYCERIDES: 72 mg/dL (ref 0.0–149.0)
Total CHOL/HDL Ratio: 3
VLDL: 14.4 mg/dL (ref 0.0–40.0)

## 2017-08-23 LAB — PSA: PSA: 7.28 ng/mL — AB (ref 0.10–4.00)

## 2017-08-23 LAB — TSH: TSH: 1.47 u[IU]/mL (ref 0.35–4.50)

## 2017-08-23 NOTE — Progress Notes (Signed)
Subjective:    Patient ID: Ian Roman, male    DOB: Apr 25, 1945, 72 y.o.   MRN: [id number]  HPI  72 year old patient who is seen today for annual preventive health examination and subsequent Medicare wellness visit He has a history of coronary artery disease status post stenting in 2007.  He is status post recent nuclear stress test which was a low risk study.  He continues to do well. He has a history of BPH and elevated PSA and also a history of occasional prostatitis. He remains on DAPT and will discuss with cardiology next month. History of colonic polyps.  Status post colonoscopy in August of last year.  He is scheduled for a sleep study. He has a history of essential hypertension as well as dyslipidemia.  Remains on statin therapy  Past Medical History:  Diagnosis Date  . Coronary artery disease    Status post stenting x2 2007  . Hyperlipidemia   . Hypertension   . Personal history of colonic polyps   . Prostatitis   . Stroke ([place])    tia   . Vertigo      Social History   Socioeconomic History  . Marital status: Married    Spouse name: Not on file  . Number of children: Not on file  . Years of education: Not on file  . Highest education level: Not on file  Occupational History  . Not on file  Social Needs  . Financial resource strain: Not on file  . Food insecurity:    Worry: Not on file    Inability: Not on file  . Transportation needs:    Medical: Not on file    Non-medical: Not on file  Tobacco Use  . Smoking status: Never Smoker  . Smokeless tobacco: Never Used  Substance and Sexual Activity  . Alcohol use: Yes    Comment: once a month per pt  . Drug use: No  . Sexual activity: Not on file  Lifestyle  . Physical activity:    Days per week: Not on file    Minutes per session: Not on file  . Stress: Not on file  Relationships  . Social connections:    Talks on phone: Not on file    Gets together: Not on file    Attends religious service: Not  on file    Active member of club or organization: Not on file    Attends meetings of clubs or organizations: Not on file    Relationship status: Not on file  . Intimate partner violence:    Fear of current or ex partner: Not on file    Emotionally abused: Not on file    Physically abused: Not on file    Forced sexual activity: Not on file  Other Topics Concern  . Not on file  Social History Narrative  . Not on file    Past Surgical History:  Procedure Laterality Date  . COLONOSCOPY    . CORONARY ANGIOPLASTY WITH STENT PLACEMENT     x2   2007    Family History  Problem Relation Age of Onset  . Heart disease Mother   . Cancer Father        prostate ca  . Colon cancer Neg Hx     Allergies  Allergen Reactions  . Zocor [Simvastatin]     Intolerance    Current Outpatient Medications on File Prior to Visit  Medication Sig Dispense Refill  . aspirin 81 MG tablet  Take 81 mg by mouth daily.    . clopidogrel (PLAVIX) 75 MG tablet TAKE 1 TABLET BY MOUTH ONCE DAILY 90 tablet 1  . co-enzyme Q-10 30 MG capsule Take 30 mg by mouth daily.    . Flaxseed, Linseed, (FLAXSEED OIL) 1000 MG CAPS Take by mouth.    . fluticasone (FLONASE) 50 MCG/ACT nasal spray Place 2 sprays into both nostrils daily. 16 g 5  . Lycopene 10 MG CAPS Take 1 capsule by mouth daily.    . metoprolol succinate (TOPROL-XL) 25 MG 24 hr tablet TAKE 1 TABLET BY MOUTH ONCE DAILY 90 tablet 1  . Multiple Vitamin tablet Take by mouth.    . NON FORMULARY CPAP machine     . rosuvastatin (CRESTOR) 10 MG tablet Take 0.5 tablets (5 mg total) by mouth daily. 45 tablet 0  . sildenafil (VIAGRA) 100 MG tablet TAKE ONE TABLET BY MOUTH AS NEEDED FOR  ERECTILE  DYSFUNCTION 20 tablet 2   Current Facility-Administered Medications on File Prior to Visit  Medication Dose Route Frequency Provider Last Rate Last Dose  . 0.9 %  sodium chloride infusion  500 mL Intravenous Continuous Gatha Mayer, MD        BP 130/78 (BP Location:  Right Arm, Patient Position: Sitting, Cuff Size: Normal)   Pulse (!) 54   Temp 98 F (36.7 C) (Oral)   Ht 5' 8.25" (1.734 m)   Wt 161 lb (73 kg)   SpO2 99%   BMI 24.30 kg/m   Subsequent Medicare wellness visit  1. Risk factors, based on past  M,S,F history.  Patient has known coronary artery disease.  Status post stenting.  Cardiovascular risk factors include hypertension dyslipidemia.  2.  Physical activities: No activity restrictions walks daily  3.  Depression/mood: No history major depression or mood disorder  4.  Hearing: No deficits.  Does have a history of benign positional vertigo  5.  ADL's: Independent  6.  Fall risk: Low  7.  Home safety: No problems identified  8.  Height weight, and visual acuity; height and weight stable no change in visual acuity  9.  Counseling: Continue heart healthy diet and active exercise regimen  10. Lab orders based on risk factors: Laboratory update will be reviewed including PSA  11. Referral : Follow-up cardiology.  Patient has been referred for a sleep study  12. Care plan: Continue efforts at aggressive risk factor modification  13. Cognitive assessment: Alert and appropriate with normal affect.  No cognitive dysfunction  14. Screening: Patient provided with a written and personalized 5-10 year screening schedule in the AVS.    15. Provider List Update: Cardiology primary care GI    Review of Systems  Constitutional: Negative for appetite change, chills, fatigue and fever.  HENT: Negative for congestion, dental problem, ear pain, hearing loss, sore throat, tinnitus, trouble swallowing and voice change.   Eyes: Negative for pain, discharge and visual disturbance.  Respiratory: Negative for cough, chest tightness, wheezing and stridor.   Cardiovascular: Negative for chest pain, palpitations and leg swelling.  Gastrointestinal: Negative for abdominal distention, abdominal pain, blood in stool, constipation, diarrhea, nausea  and vomiting.  Genitourinary: Negative for difficulty urinating, discharge, flank pain, genital sores, hematuria and urgency.  Musculoskeletal: Negative for arthralgias, back pain, gait problem, joint swelling, myalgias and neck stiffness.  Skin: Negative for rash.  Neurological: Negative for dizziness, syncope, speech difficulty, weakness, numbness and headaches.  Hematological: Negative for adenopathy. Does not bruise/bleed easily.  Psychiatric/Behavioral:  Negative for behavioral problems and dysphoric mood. The patient is not nervous/anxious.        Objective:   Physical Exam  Constitutional: He appears well-developed and well-nourished.  HENT:  Head: Normocephalic and atraumatic.  Right Ear: External ear normal.  Left Ear: External ear normal.  Nose: Nose normal.  Mouth/Throat: Oropharynx is clear and moist.  Eyes: Pupils are equal, round, and reactive to light. Conjunctivae and EOM are normal. No scleral icterus.  Neck: Normal range of motion. Neck supple. No JVD present. No thyromegaly present.  Cardiovascular: Regular rhythm, normal heart sounds and intact distal pulses. Exam reveals no gallop and no friction rub.  No murmur heard. Dorsalis pedis pulses faint Right posterior tibial pulse +3 Left posterior tibial pulse +2  Pulmonary/Chest: Effort normal and breath sounds normal. He exhibits no tenderness.  Abdominal: Soft. Bowel sounds are normal. He exhibits no distension and no mass. There is no tenderness.  Genitourinary: Penis normal. Rectal exam shows guaiac negative stool.  Genitourinary Comments: Prostate +3 enlarged and symmetrical  Musculoskeletal: Normal range of motion. He exhibits no edema or tenderness.  Lymphadenopathy:    He has no cervical adenopathy.  Neurological: He is alert. He has normal reflexes. No cranial nerve deficit. Coordination normal.  Skin: Skin is warm and dry. No rash noted.  Psychiatric: He has a normal mood and affect. His behavior is  normal.          Assessment & Plan:   Preventive health examination Subsequent Medicare wellness visit History of OSA.  Follow-up sleep study ordered Essential hypertension stable Coronary artery disease stable.  Status post recent nuclear stress test.  Patient will discuss with cardiology next month about continuing DAPT History of TIA BPH with history of elevated PSA.  Will recheck PSA Dyslipidemia continue statin therapy  Follow-up 6 months  , [name]

## 2017-08-23 NOTE — Patient Instructions (Signed)
Cardiology follow-up as scheduled  Limit your sodium (Salt) intake    It is important that you exercise regularly, at least 20 minutes 3 to 4 times per week.  If you develop chest pain or shortness of breath seek  medical attention.  Return in 6 months for follow-up

## 2017-08-24 ENCOUNTER — Other Ambulatory Visit: Payer: Self-pay | Admitting: Internal Medicine

## 2017-08-24 DIAGNOSIS — R972 Elevated prostate specific antigen [PSA]: Secondary | ICD-10-CM

## 2017-08-24 LAB — HEPATITIS C ANTIBODY
Hepatitis C Ab: NONREACTIVE
SIGNAL TO CUT-OFF: 0.04 (ref <1.00)

## 2017-08-25 ENCOUNTER — Telehealth: Payer: Self-pay

## 2017-09-06 ENCOUNTER — Other Ambulatory Visit: Payer: Self-pay | Admitting: Internal Medicine

## 2017-09-10 ENCOUNTER — Other Ambulatory Visit: Payer: Self-pay

## 2017-09-12 ENCOUNTER — Encounter (HOSPITAL_BASED_OUTPATIENT_CLINIC_OR_DEPARTMENT_OTHER): Payer: Medicare PPO

## 2017-09-26 ENCOUNTER — Ambulatory Visit (HOSPITAL_BASED_OUTPATIENT_CLINIC_OR_DEPARTMENT_OTHER): Payer: Medicare PPO | Attending: Interventional Cardiology | Admitting: Cardiology

## 2017-09-26 VITALS — Ht 69.0 in | Wt 170.0 lb

## 2017-09-26 DIAGNOSIS — I471 Supraventricular tachycardia: Secondary | ICD-10-CM | POA: Insufficient documentation

## 2017-09-26 DIAGNOSIS — G4730 Sleep apnea, unspecified: Secondary | ICD-10-CM | POA: Diagnosis not present

## 2017-09-26 DIAGNOSIS — G4733 Obstructive sleep apnea (adult) (pediatric): Secondary | ICD-10-CM | POA: Diagnosis not present

## 2017-09-27 NOTE — Procedures (Signed)
   Patient Name: Ian Roman, Ian Roman Study Date:03/09/2017 09/26/2017 Gender: Male D.O.B: 1945-06-03 Age (years): 72 Referring Provider: Larae Grooms Height (inches): 98 Interpreting Physician: Fransico Him MD, ABSM Weight (lbs): 170 RPSGT: Zadie Rhine BMI: 25 MRN: [id number] Neck Size: 16.00  CLINICAL INFORMATION Sleep Study Type: NPSG  Indication for sleep study: OSA  Epworth Sleepiness Score: 13  SLEEP STUDY TECHNIQUE As per the AASM Manual for the Scoring of Sleep and Associated Events v2.3 (April 2016) with a hypopnea requiring 4% desaturations.  The channels recorded and monitored were frontal, central and occipital EEG, electrooculogram (EOG), submentalis EMG (chin), nasal and oral airflow, thoracic and abdominal wall motion, anterior tibialis EMG, snore microphone, electrocardiogram, and pulse oximetry.  MEDICATIONS Medications self-administered by patient taken the night of the study : N/A  SLEEP ARCHITECTURE The study was initiated at 9:59:45 PM and ended at 4:49:28 AM.  Sleep onset time was 12.8 minutes and the sleep efficiency was 62.4%%. The total sleep time was 255.5 minutes.  Stage REM latency was 198.5 minutes.  The patient spent 5.9%% of the night in stage N1 sleep, 68.9%% in stage N2 sleep, 0.0%% in stage N3 and 25.24% in REM.  Alpha intrusion was absent.  Supine sleep was 36.94%.  RESPIRATORY PARAMETERS The overall apnea/hypopnea index (AHI) was 0.9 per hour. There were 3 total apneas, including 2 obstructive, 1 central and 0 mixed apneas. There were 1 hypopneas and 22 RERAs.  The AHI during Stage REM sleep was 2.8 per hour.  AHI while supine was 1.3 per hour.  The mean oxygen saturation was 96.8%. The minimum SpO2 during sleep was 90.0%.  snoring was noted during this study.  CARDIAC DATA The 2 lead EKG demonstrated sinus rhythm. The mean heart rate was 45.3 beats per minute. Other EKG findings include: Frequent bouts of nonsustained atrial  tachycardia.  LEG MOVEMENT DATA The total PLMS were 0 with a resulting PLMS index of 0.0. Associated arousal with leg movement index was 12.2 .  IMPRESSIONS - No significant obstructive sleep apnea occurred during this study (AHI = 0.9/h). - No significant central sleep apnea occurred during this study (CAI = 0.2/h). - The patient had minimal or no oxygen desaturation during the study (Min O2 = 90.0%) - No snoring was audible during this study. - Frequent bouts of nonsustained atrial tachycardia were noted during this study. - Clinically significant periodic limb movements did not occur during sleep. Associated arousals were significant.  DIAGNOSIS - Normal study - Frequent nonsustained atrial tachycardia  RECOMMENDATIONS - Avoid alcohol, sedatives and other CNS depressants that may worsen sleep apnea and disrupt normal sleep architecture. - Sleep hygiene should be reviewed to assess factors that may improve sleep quality. - Weight management and regular exercise should be initiated or continued if appropriate.  [Electronically signed] 09/27/2017 05:19 PM  Fransico Him MD, ABSM Diplomate, American Board of Sleep Medicine

## 2017-09-28 ENCOUNTER — Telehealth: Payer: Self-pay | Admitting: *Deleted

## 2017-09-28 NOTE — Telephone Encounter (Signed)
Informed patient of sleep study results and patient understanding was verbalized. Patient understands his sleep study showed no significant sleep apnea.   Pt is aware and agreeable to normal results. 

## 2017-09-28 NOTE — Telephone Encounter (Signed)
-----   Message from Ian Margarita, MD sent at 09/27/2017  5:23 PM EDT ----- Please let patient know that sleep study showed no significant sleep apnea.

## 2017-09-30 ENCOUNTER — Other Ambulatory Visit: Payer: Self-pay | Admitting: Internal Medicine

## 2017-10-15 NOTE — Telephone Encounter (Signed)
Entered Chart in error!

## 2017-12-03 DIAGNOSIS — R350 Frequency of micturition: Secondary | ICD-10-CM | POA: Diagnosis not present

## 2017-12-03 DIAGNOSIS — N401 Enlarged prostate with lower urinary tract symptoms: Secondary | ICD-10-CM | POA: Diagnosis not present

## 2017-12-03 DIAGNOSIS — R351 Nocturia: Secondary | ICD-10-CM | POA: Diagnosis not present

## 2017-12-03 DIAGNOSIS — R972 Elevated prostate specific antigen [PSA]: Secondary | ICD-10-CM | POA: Diagnosis not present

## 2017-12-22 ENCOUNTER — Telehealth: Payer: Self-pay

## 2017-12-22 NOTE — Telephone Encounter (Signed)
Received a fax for pt 12/06/2017 stating if it was ok for pt to stop his Plavix and his Aspirin 81mg  7 days before his prostate Bx 12/30/2017. Erica from [facility] was notified via fax that this was okay. However, Dr. Burnice Logan signed the bottom without writing the Okay. I advise [name] that he meant Okay. I resent the fax stating "Okay to stop Rx" and redone Dr.Kwiakowski signature. No further action needed!

## 2017-12-30 DIAGNOSIS — R972 Elevated prostate specific antigen [PSA]: Secondary | ICD-10-CM | POA: Diagnosis not present

## 2017-12-30 DIAGNOSIS — C61 Malignant neoplasm of prostate: Secondary | ICD-10-CM | POA: Diagnosis not present

## 2018-01-07 DIAGNOSIS — C61 Malignant neoplasm of prostate: Secondary | ICD-10-CM | POA: Diagnosis not present

## 2018-01-26 ENCOUNTER — Ambulatory Visit (INDEPENDENT_AMBULATORY_CARE_PROVIDER_SITE_OTHER): Payer: Medicare PPO | Admitting: *Deleted

## 2018-01-26 DIAGNOSIS — Z23 Encounter for immunization: Secondary | ICD-10-CM | POA: Diagnosis not present

## 2018-02-23 ENCOUNTER — Encounter: Payer: Medicare PPO | Admitting: Internal Medicine

## 2018-03-01 ENCOUNTER — Encounter: Payer: Medicare PPO | Admitting: Internal Medicine

## 2018-03-02 ENCOUNTER — Ambulatory Visit (INDEPENDENT_AMBULATORY_CARE_PROVIDER_SITE_OTHER): Payer: Medicare PPO | Admitting: Internal Medicine

## 2018-03-02 ENCOUNTER — Encounter: Payer: Self-pay | Admitting: Internal Medicine

## 2018-03-02 VITALS — BP 120/70 | HR 57 | Temp 98.3°F | Wt 167.1 lb

## 2018-03-02 DIAGNOSIS — I10 Essential (primary) hypertension: Secondary | ICD-10-CM | POA: Diagnosis not present

## 2018-03-02 DIAGNOSIS — N529 Male erectile dysfunction, unspecified: Secondary | ICD-10-CM

## 2018-03-02 DIAGNOSIS — E785 Hyperlipidemia, unspecified: Secondary | ICD-10-CM

## 2018-03-02 DIAGNOSIS — K2100 Gastro-esophageal reflux disease with esophagitis, without bleeding: Secondary | ICD-10-CM

## 2018-03-02 DIAGNOSIS — I739 Peripheral vascular disease, unspecified: Secondary | ICD-10-CM

## 2018-03-02 DIAGNOSIS — I2510 Atherosclerotic heart disease of native coronary artery without angina pectoris: Secondary | ICD-10-CM

## 2018-03-02 DIAGNOSIS — I779 Disorder of arteries and arterioles, unspecified: Secondary | ICD-10-CM

## 2018-03-02 NOTE — Patient Instructions (Signed)
-  It was nice meeting you today!  -I will see you back in June 2020 for your annual physical exam.  Please make sure you come fasting to that visit.  Come see [place] sooner if you have any acute issues.

## 2018-03-02 NOTE — Progress Notes (Addendum)
Established Patient Office Visit     CC/Reason for Visit: Establish care, follow up on chronic medical issues  HPI: Ian Roman is a 72 y.o. male who is coming in today for the above mentioned reasons.  Due for annual physical in June 2020.  Past Medical History is significant for: CAD s/p stenting with a low risk stress test earlier this year, HLD, h/o elevated PSA states he had a prostate bx earlier this year that was normal (see no record of this in the chart) and GERD. GERS has been stable without symptoms off medications. Hyperlipidemia is stable, HTN has been stable. He has no acute complaints today.   Past Medical/Surgical History: Past Medical History:  Diagnosis Date  . Coronary artery disease    Status post stenting x2 2007  . Hyperlipidemia   . Hypertension   . Personal history of colonic polyps   . Prostatitis   . Stroke ([place])    tia   . Vertigo     Past Surgical History:  Procedure Laterality Date  . COLONOSCOPY    . CORONARY ANGIOPLASTY WITH STENT PLACEMENT     x2   2007    Social History:  reports that he has never smoked. He has never used smokeless tobacco. He reports that he drinks alcohol. He reports that he does not use drugs.  Allergies: Allergies  Allergen Reactions  . Zocor [Simvastatin]     Intolerance    Family History:  Family History  Problem Relation Age of Onset  . Heart disease Mother   . Cancer Father        prostate ca  . Colon cancer Neg Hx      Current Outpatient Medications:  .  aspirin 81 MG tablet, Take 81 mg by mouth daily., Disp: , Rfl:  .  clopidogrel (PLAVIX) 75 MG tablet, TAKE 1 TABLET BY MOUTH ONCE DAILY, Disp: 90 tablet, Rfl: 1 .  co-enzyme Q-10 30 MG capsule, Take 30 mg by mouth daily., Disp: , Rfl:  .  Flaxseed, Linseed, (FLAXSEED OIL) 1000 MG CAPS, Take by mouth., Disp: , Rfl:  .  fluticasone (FLONASE) 50 MCG/ACT nasal spray, Place 2 sprays into both nostrils daily., Disp: 16 g, Rfl: 5 .  Lycopene 10  MG CAPS, Take 1 capsule by mouth daily., Disp: , Rfl:  .  metoprolol succinate (TOPROL-XL) 25 MG 24 hr tablet, TAKE 1 TABLET BY MOUTH ONCE DAILY, Disp: 90 tablet, Rfl: 1 .  Multiple Vitamin tablet, Take by mouth., Disp: , Rfl:  .  NON FORMULARY, CPAP machine , Disp: , Rfl:  .  rosuvastatin (CRESTOR) 10 MG tablet, Take 0.5 tablets (5 mg total) by mouth daily., Disp: 45 tablet, Rfl: 0 .  sildenafil (VIAGRA) 100 MG tablet, TAKE ONE TABLET BY MOUTH AS NEEDED FOR  ERECTILE  DYSFUNCTION, Disp: 20 tablet, Rfl: 2  Review of Systems:  Constitutional: Denies fever, chills, diaphoresis, appetite change and fatigue.  HEENT: Denies photophobia, eye pain, redness, hearing loss, ear pain, congestion, sore throat, rhinorrhea, sneezing, mouth sores, trouble swallowing, neck pain, neck stiffness and tinnitus.   Respiratory: Denies SOB, DOE, cough, chest tightness,  and wheezing.   Cardiovascular: Denies chest pain, palpitations and leg swelling.  Gastrointestinal: Denies nausea, vomiting, abdominal pain, diarrhea, constipation, blood in stool and abdominal distention.  Genitourinary: Denies dysuria, urgency, frequency, hematuria, flank pain and difficulty urinating.  Endocrine: Denies: hot or cold intolerance, sweats, changes in hair or nails, polyuria, polydipsia. Musculoskeletal: Denies myalgias,  back pain, joint swelling, arthralgias and gait problem.  Skin: Denies pallor, rash and wound.  Neurological: Denies dizziness, seizures, syncope, weakness, light-headedness, numbness and headaches.  Hematological: Denies adenopathy. Easy bruising, personal or family bleeding history  Psychiatric/Behavioral: Denies suicidal ideation, mood changes, confusion, nervousness, sleep disturbance and agitation    Physical Exam: Vitals:   03/02/18 0658  BP: 120/70  Pulse: (!) 57  Temp: 98.3 F (36.8 C)  TempSrc: Oral  SpO2: 99%  Weight: 167 lb 1.6 oz (75.8 kg)    Body mass index is 24.68  kg/m.   Constitutional: NAD, calm, comfortable Eyes: PERRL, lids and conjunctivae normal ENMT: Mucous membranes are moist. Posterior pharynx clear of any exudate or lesions. Normal dentition.  Neck: normal, supple, no masses, no thyromegaly Respiratory: clear to auscultation bilaterally, no wheezing, no crackles. Normal respiratory effort. No accessory muscle use.  Cardiovascular: Regular rate and rhythm, no murmurs / rubs / gallops. No extremity edema. 2+ pedal pulses. No carotid bruits.  Abdomen: no tenderness, no masses palpated. No hepatosplenomegaly. Bowel sounds positive.  Musculoskeletal: no clubbing / cyanosis. No joint deformity upper and lower extremities. Good ROM, no contractures. Normal muscle tone.  Skin: no rashes, lesions, ulcers. No induration Neurologic: grossly intact and non-focal Psychiatric: Normal judgment and insight. Alert and oriented x 3. Normal mood.    Impression and Plan:  Coronary artery disease involving native coronary artery of native heart without angina pectoris -Stable, no CP or other symptoms, had a low-risk stress test earlier this month. -Is wondering whether he needs to continue DAPT. Will ask Dr. Scarlette Calico, but suspect at this point it might be ok to DC plavix and continue on ASA only.  Addendum: Received response from Dr. Scarlette Calico. As he has Ist Gen DES, he would prefer that pt continue plavix but may DC ASA.  Essential hypertension -Well controlled on metoprolol.  Dyslipidemia -On rosuvastatin 5mg . -LDL 87 in 6/19.  Erectile dysfunction, unspecified erectile dysfunction type -Uses viagra PRN.  Gastroesophageal reflux disease with esophagitis -Not on meds, asymptomatic.    Patient Instructions  -It was nice meeting you today!  -I will see you back in June 2020 for your annual physical exam.  Please make sure you come fasting to that visit.  Come see [place] sooner if you have any acute issues.     Lelon Frohlich,  MD [facility]

## 2018-03-08 ENCOUNTER — Telehealth: Payer: Self-pay | Admitting: *Deleted

## 2018-03-08 NOTE — Telephone Encounter (Signed)
Pt returned call and message given to him from Dr. Vara Guardian, to continue with the Plavix and he can stop the ASA. Pt voiced understanding.

## 2018-03-08 NOTE — Telephone Encounter (Signed)
Left message on machine for patient to return our call CRM 

## 2018-03-08 NOTE — Telephone Encounter (Signed)
-----   Message from Erline Hau, MD sent at 03/04/2018  7:11 AM EST ----- Marykay Lex, Do you mind letting the patient know that I spoke with his cardiologist, Dr. Scarlette Calico, and he advised to continue plavix but he may stop the ASA.  Thanks, Estela H.

## 2018-03-30 ENCOUNTER — Encounter: Payer: Self-pay | Admitting: Internal Medicine

## 2018-03-30 ENCOUNTER — Ambulatory Visit (INDEPENDENT_AMBULATORY_CARE_PROVIDER_SITE_OTHER): Payer: Medicare PPO | Admitting: Internal Medicine

## 2018-03-30 VITALS — BP 130/80 | HR 73 | Temp 98.4°F | Ht 69.0 in | Wt 164.2 lb

## 2018-03-30 DIAGNOSIS — J069 Acute upper respiratory infection, unspecified: Secondary | ICD-10-CM

## 2018-03-30 DIAGNOSIS — R05 Cough: Secondary | ICD-10-CM | POA: Diagnosis not present

## 2018-03-30 DIAGNOSIS — R059 Cough, unspecified: Secondary | ICD-10-CM

## 2018-03-30 NOTE — Patient Instructions (Signed)
-  It was nice seeing you today!  -Continue taking Claritin once daily, may continue to take Mucinex up to twice daily.  Can add Delsym twice daily for coughing as needed.  You should feel better over the course of the next 7 to 10 days.  -If you are not better by 14 days, he started to develop fevers, chills with difficulty breathing please come back to see [place], otherwise follow-up as scheduled.

## 2018-03-30 NOTE — Progress Notes (Signed)
Acute Office Visit     CC/Reason for Visit: Cough, runny nose, sore throat  HPI: Ian Roman is a 73 y.o. male who is coming in today for the above mentioned reasons.  He comes in today for evaluation of a cough.  About 6 days ago he started having a runny nose with significant sore throat.  He used Claritin for a few days with no significant improvement, stopped the Claritin and instead started taking Mucinex twice daily.  Over the past 3 days he has developed what he calls a hacking dry cough.  No sputum production, denies fevers or chills, no shortness of breath or chest pain.  No ear pain.   Past Medical/Surgical History: Past Medical History:  Diagnosis Date  . Coronary artery disease    Status post stenting x2 2007  . Hyperlipidemia   . Hypertension   . Personal history of colonic polyps   . Prostatitis   . Stroke ([place])    tia   . Vertigo     Past Surgical History:  Procedure Laterality Date  . COLONOSCOPY    . CORONARY ANGIOPLASTY WITH STENT PLACEMENT     x2   2007    Social History:  reports that he has never smoked. He has never used smokeless tobacco. He reports current alcohol use. He reports that he does not use drugs.  Allergies: Allergies  Allergen Reactions  . Zocor [Simvastatin]     Intolerance    Family History:  Family History  Problem Relation Age of Onset  . Heart disease Mother   . Cancer Father        prostate ca  . Colon cancer Neg Hx      Current Outpatient Medications:  .  aspirin 81 MG tablet, Take 81 mg by mouth daily., Disp: , Rfl:  .  clopidogrel (PLAVIX) 75 MG tablet, TAKE 1 TABLET BY MOUTH ONCE DAILY, Disp: 90 tablet, Rfl: 1 .  co-enzyme Q-10 30 MG capsule, Take 30 mg by mouth daily., Disp: , Rfl:  .  Flaxseed, Linseed, (FLAXSEED OIL) 1000 MG CAPS, Take by mouth., Disp: , Rfl:  .  fluticasone (FLONASE) 50 MCG/ACT nasal spray, Place 2 sprays into both nostrils daily., Disp: 16 g, Rfl: 5 .  Lycopene 10 MG CAPS, Take 1  capsule by mouth daily., Disp: , Rfl:  .  metoprolol succinate (TOPROL-XL) 25 MG 24 hr tablet, TAKE 1 TABLET BY MOUTH ONCE DAILY, Disp: 90 tablet, Rfl: 1 .  Multiple Vitamin tablet, Take by mouth., Disp: , Rfl:  .  NON FORMULARY, CPAP machine , Disp: , Rfl:  .  rosuvastatin (CRESTOR) 10 MG tablet, Take 0.5 tablets (5 mg total) by mouth daily., Disp: 45 tablet, Rfl: 0 .  sildenafil (VIAGRA) 100 MG tablet, TAKE ONE TABLET BY MOUTH AS NEEDED FOR  ERECTILE  DYSFUNCTION, Disp: 20 tablet, Rfl: 2  Review of Systems:  Constitutional: Denies fever, chills, diaphoresis, appetite change and fatigue.  HEENT: Denies photophobia, eye pain, redness, hearing loss, ear pain,  sneezing, mouth sores, trouble swallowing, neck pain, neck stiffness and tinnitus.   Respiratory: Denies SOB, DOE, cough, chest tightness,  and wheezing.   Cardiovascular: Denies chest pain, palpitations and leg swelling.  Gastrointestinal: Denies nausea, vomiting, abdominal pain, diarrhea, constipation, blood in stool and abdominal distention.  Genitourinary: Denies dysuria, urgency, frequency, hematuria, flank pain and difficulty urinating.  Endocrine: Denies: hot or cold intolerance, sweats, changes in hair or nails, polyuria, polydipsia. Musculoskeletal: Denies myalgias,  back pain, joint swelling, arthralgias and gait problem.  Skin: Denies pallor, rash and wound.  Neurological: Denies dizziness, seizures, syncope, weakness, light-headedness, numbness and headaches.  Hematological: Denies adenopathy. Easy bruising, personal or family bleeding history  Psychiatric/Behavioral: Denies suicidal ideation, mood changes, confusion, nervousness, sleep disturbance and agitation    Physical Exam: Vitals:   03/30/18 0740  BP: 130/80  Pulse: 73  Temp: 98.4 F (36.9 C)  TempSrc: Oral  SpO2: 98%  Weight: 164 lb 3.2 oz (74.5 kg)  Height: 5\' 9"  (1.753 m)    Body mass index is 24.25 kg/m.   Constitutional: NAD, calm,  comfortable Eyes: PERRL, lids and conjunctivae normal, wears corrective lenses ENMT: Mucous membranes are moist. Posterior pharynx erythematous but without plaques or exudates.  Normal dentition. Tympanic membrane is pearly white, no erythema or bulging. Neck: normal, supple, no masses, no thyromegaly Respiratory: clear to auscultation bilaterally, no wheezing, no crackles. Normal respiratory effort. No accessory muscle use.  Cardiovascular: Regular rate and rhythm, no murmurs / rubs / gallops. No extremity edema. 2+ pedal pulses. No carotid bruits.  Neurologic: CN 2-12 grossly intact. Sensation intact, DTR normal. Strength 5/5 in all 4.  Psychiatric: Normal judgment and insight. Alert and oriented x 3. Normal mood.    Impression and Plan:  Viral upper respiratory tract infection Cough -Suspect this is a viral URI with cough as a manifestation of postnasal drip. -Have recommended continued use of daily Claritin, may use Mucinex twice daily and an over-the-counter cough suppressant such as Delsym up to twice daily. -He is advised that symptoms should slowly resolve over the next 7 to 10 days and has been told to follow-up with [place] if he develops any warning signs such as fever, chest pain, shortness of breath or persistence of symptoms past 14 days.    Patient Instructions  -It was nice seeing you today!  -Continue taking Claritin once daily, may continue to take Mucinex up to twice daily.  Can add Delsym twice daily for coughing as needed.  You should feel better over the course of the next 7 to 10 days.  -If you are not better by 14 days, he started to develop fevers, chills with difficulty breathing please come back to see [place], otherwise follow-up as scheduled.     Lelon Frohlich, MD  Primary [facility] at [facility]

## 2018-04-06 ENCOUNTER — Other Ambulatory Visit: Payer: Self-pay | Admitting: Internal Medicine

## 2018-04-11 ENCOUNTER — Telehealth: Payer: Self-pay | Admitting: Internal Medicine

## 2018-04-11 NOTE — Telephone Encounter (Signed)
Patient states he has been trying to get his Metaprolol refilled for two weeks by going to the pharmacy.  Pharmacy states they have not heard back.  Patient has been out for 2 weeks.    [facility]: [facility] [address]

## 2018-04-12 NOTE — Telephone Encounter (Signed)
Spoke with pharmacist and patient picked up refill 04/11/2018.

## 2018-05-13 ENCOUNTER — Other Ambulatory Visit: Payer: Self-pay | Admitting: Internal Medicine

## 2018-05-13 MED ORDER — ROSUVASTATIN CALCIUM 10 MG PO TABS: 5.0000 mg | ORAL_TABLET | Freq: Every day | ORAL | 1 refills | Status: DC

## 2018-05-16 ENCOUNTER — Other Ambulatory Visit: Payer: Self-pay | Admitting: Urology

## 2018-05-16 DIAGNOSIS — C61 Malignant neoplasm of prostate: Secondary | ICD-10-CM

## 2018-07-08 ENCOUNTER — Ambulatory Visit
Admission: RE | Admit: 2018-07-08 | Discharge: 2018-07-08 | Disposition: A | Discharge status: Home / Self Care | Payer: Medicare PPO | Source: Ambulatory Visit | Attending: Urology | Admitting: Urology

## 2018-07-08 ENCOUNTER — Other Ambulatory Visit: Payer: Self-pay

## 2018-07-08 DIAGNOSIS — C61 Malignant neoplasm of prostate: Secondary | ICD-10-CM

## 2018-07-08 MED ORDER — GADOBENATE DIMEGLUMINE 529 MG/ML IV SOLN
15.0000 mL | Freq: Once | INTRAVENOUS | Status: AC | PRN
  Administered 2018-07-08: 15 mL via INTRAVENOUS

## 2018-07-11 ENCOUNTER — Other Ambulatory Visit: Payer: Medicare PPO

## 2018-09-07 ENCOUNTER — Ambulatory Visit (INDEPENDENT_AMBULATORY_CARE_PROVIDER_SITE_OTHER): Payer: Medicare PPO | Admitting: Internal Medicine

## 2018-09-07 ENCOUNTER — Other Ambulatory Visit: Payer: Self-pay

## 2018-09-07 ENCOUNTER — Encounter: Payer: Self-pay | Admitting: Internal Medicine

## 2018-09-07 VITALS — BP 130/80 | HR 64 | Temp 98.8°F | Ht 69.5 in | Wt 164.3 lb

## 2018-09-07 DIAGNOSIS — I10 Essential (primary) hypertension: Secondary | ICD-10-CM

## 2018-09-07 DIAGNOSIS — Z86010 Personal history of colonic polyps: Secondary | ICD-10-CM

## 2018-09-07 DIAGNOSIS — I779 Disorder of arteries and arterioles, unspecified: Secondary | ICD-10-CM

## 2018-09-07 DIAGNOSIS — E785 Hyperlipidemia, unspecified: Secondary | ICD-10-CM | POA: Diagnosis not present

## 2018-09-07 DIAGNOSIS — R972 Elevated prostate specific antigen [PSA]: Secondary | ICD-10-CM

## 2018-09-07 DIAGNOSIS — I739 Peripheral vascular disease, unspecified: Secondary | ICD-10-CM | POA: Diagnosis not present

## 2018-09-07 DIAGNOSIS — N401 Enlarged prostate with lower urinary tract symptoms: Secondary | ICD-10-CM

## 2018-09-07 DIAGNOSIS — I2510 Atherosclerotic heart disease of native coronary artery without angina pectoris: Secondary | ICD-10-CM

## 2018-09-07 DIAGNOSIS — N138 Other obstructive and reflux uropathy: Secondary | ICD-10-CM

## 2018-09-07 DIAGNOSIS — Z0000 Encounter for general adult medical examination without abnormal findings: Secondary | ICD-10-CM

## 2018-09-07 LAB — CBC WITH DIFFERENTIAL/PLATELET
Basophils Absolute: 0 10*3/uL (ref 0.0–0.1)
Basophils Relative: 0.7 % (ref 0.0–3.0)
Eosinophils Absolute: 0.1 10*3/uL (ref 0.0–0.7)
Eosinophils Relative: 1.1 % (ref 0.0–5.0)
HCT: 40.3 % (ref 39.0–52.0)
Hemoglobin: 13.8 g/dL (ref 13.0–17.0)
Lymphocytes Relative: 18 % (ref 12.0–46.0)
Lymphs Abs: 1 10*3/uL (ref 0.7–4.0)
MCHC: 34.2 g/dL (ref 30.0–36.0)
MCV: 96.9 fl (ref 78.0–100.0)
Monocytes Absolute: 0.4 10*3/uL (ref 0.1–1.0)
Monocytes Relative: 7.9 % (ref 3.0–12.0)
Neutro Abs: 4 10*3/uL (ref 1.4–7.7)
Neutrophils Relative %: 72.3 % (ref 43.0–77.0)
Platelets: 207 10*3/uL (ref 150.0–400.0)
RBC: 4.15 Mil/uL — ABNORMAL LOW (ref 4.22–5.81)
RDW: 12.6 % (ref 11.5–15.5)
WBC: 5.5 10*3/uL (ref 4.0–10.5)

## 2018-09-07 LAB — COMPREHENSIVE METABOLIC PANEL
ALT: 24 U/L (ref 0–53)
AST: 22 U/L (ref 0–37)
Albumin: 4.7 g/dL (ref 3.5–5.2)
Alkaline Phosphatase: 66 U/L (ref 39–117)
BUN: 14 mg/dL (ref 6–23)
CO2: 30 mEq/L (ref 19–32)
Calcium: 9.6 mg/dL (ref 8.4–10.5)
Chloride: 100 mEq/L (ref 96–112)
Creatinine, Ser: 1.1 mg/dL (ref 0.40–1.50)
GFR: 79.38 mL/min (ref >60.00)
Glucose, Bld: 104 mg/dL — ABNORMAL HIGH (ref 70–99)
Potassium: 4.2 mEq/L (ref 3.5–5.1)
Sodium: 138 mEq/L (ref 135–145)
Total Bilirubin: 1.2 mg/dL (ref 0.2–1.2)
Total Protein: 7.3 g/dL (ref 6.0–8.3)

## 2018-09-07 LAB — VITAMIN B12: Vitamin B-12: 436 pg/mL (ref 211–911)

## 2018-09-07 LAB — LIPID PANEL
Cholesterol: 146 mg/dL (ref 0–200)
HDL: 45.5 mg/dL (ref >39.00)
LDL Cholesterol: 88 mg/dL (ref 0–99)
NonHDL: 100.59
Total CHOL/HDL Ratio: 3
Triglycerides: 65 mg/dL (ref 0.0–149.0)
VLDL: 13 mg/dL (ref 0.0–40.0)

## 2018-09-07 LAB — HEMOGLOBIN A1C: Hgb A1c MFr Bld: 4.9 % (ref 4.6–6.5)

## 2018-09-07 LAB — VITAMIN D 25 HYDROXY (VIT D DEFICIENCY, FRACTURES): VITD: 42.41 ng/mL (ref 30.00–100.00)

## 2018-09-07 LAB — TSH: TSH: 1.61 u[IU]/mL (ref 0.35–4.50)

## 2018-09-07 NOTE — Progress Notes (Signed)
Established Patient Office Visit     CC/Reason for Visit: Annual preventive exam and subsequent Medicare wellness visit  HPI: Ian Roman is a 73 y.o. male who is coming in today for the above mentioned reasons. Past Medical History is significant for: Coronary artery disease status post stenting with a low risk stress test in 2019 follows up with cardiology on an annual basis or as needed, hyperlipidemia that has been well controlled, GERD, hypertension that is well controlled, and elevated PSA for which she had a prostate biopsy earlier this year with Dr. Gloriann Loan that was normal, he is scheduled for repeat prostate biopsy in October.  He has no acute complaints today.  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 He eats healthy.  He had a colonoscopy in August 2018 and is a 3-year recall.  His vaccinations are up-to-date, we have discussed shingles which he will obtain at the pharmacy.  He has no acute complaints today.   Past Medical/Surgical History: Past Medical History:  Diagnosis Date  . Coronary artery disease    Status post stenting x2 2007  . Hyperlipidemia   . Hypertension   . Personal history of colonic polyps   . Prostatitis   . Stroke ([place])    tia   . Vertigo     Past Surgical History:  Procedure Laterality Date  . COLONOSCOPY    . CORONARY ANGIOPLASTY WITH STENT PLACEMENT     x2   2007    Social History:  reports that he has never smoked. He has never used smokeless tobacco. He reports current alcohol use. He reports that he does not use drugs.  Allergies: Allergies  Allergen Reactions  . Zocor [Simvastatin]     Intolerance    Family History:  Family History  Problem Relation Age of Onset  . Heart disease Mother   . Cancer Father        prostate ca  . Colon cancer Neg Hx      Current Outpatient Medications:  .  aspirin 81 MG tablet, Take 81 mg by mouth daily., Disp: ,  Rfl:  .  clopidogrel (PLAVIX) 75 MG tablet, TAKE 1 TABLET BY MOUTH ONCE DAILY, Disp: 90 tablet, Rfl: 1 .  co-enzyme Q-10 30 MG capsule, Take 30 mg by mouth daily., Disp: , Rfl:  .  Flaxseed, Linseed, (FLAXSEED OIL) 1000 MG CAPS, Take by mouth., Disp: , Rfl:  .  fluticasone (FLONASE) 50 MCG/ACT nasal spray, Place 2 sprays into both nostrils daily., Disp: 16 g, Rfl: 5 .  Lycopene 10 MG CAPS, Take 1 capsule by mouth daily., Disp: , Rfl:  .  metoprolol succinate (TOPROL-XL) 25 MG 24 hr tablet, TAKE 1 TABLET BY MOUTH ONCE DAILY, Disp: 90 tablet, Rfl: 1 .  Multiple Vitamin tablet, Take by mouth., Disp: , Rfl:  .  NON FORMULARY, CPAP machine , Disp: , Rfl:  .  rosuvastatin (CRESTOR) 10 MG tablet, Take 0.5 tablets (5 mg total) by mouth daily., Disp: 90 tablet, Rfl: 1 .  sildenafil (VIAGRA) 100 MG tablet, TAKE ONE TABLET BY MOUTH AS NEEDED FOR  ERECTILE  DYSFUNCTION, Disp: 20 tablet, Rfl: 2  Review of Systems:  Constitutional: Denies fever, chills, diaphoresis, appetite change and fatigue.  HEENT: Denies photophobia, eye pain, redness, hearing loss, ear pain, congestion, sore throat, rhinorrhea, sneezing, mouth sores, trouble swallowing, neck pain, neck stiffness and tinnitus.   Respiratory: Denies  SOB, DOE, cough, chest tightness,  and wheezing.   Cardiovascular: Denies chest pain, palpitations and leg swelling.  Gastrointestinal: Denies nausea, vomiting, abdominal pain, diarrhea, constipation, blood in stool and abdominal distention.  Genitourinary: Denies dysuria, urgency, frequency, hematuria, flank pain and difficulty urinating.  Endocrine: Denies: hot or cold intolerance, sweats, changes in hair or nails, polyuria, polydipsia. Musculoskeletal: Denies myalgias, back pain, joint swelling, arthralgias and gait problem.  Skin: Denies pallor, rash and wound.  Neurological: Denies dizziness, seizures, syncope, weakness, light-headedness, numbness and headaches.  Hematological: Denies adenopathy. Easy  bruising, personal or family bleeding history  Psychiatric/Behavioral: Denies suicidal ideation, mood changes, confusion, nervousness, sleep disturbance and agitation    Physical Exam: Vitals:   09/07/18 0746  BP: 130/80  Pulse: 64  Temp: 98.8 F (37.1 C)  TempSrc: Oral  SpO2: 97%  Weight: 164 lb 4.8 oz (74.5 kg)  Height: 5' 9.5" (1.765 m)    Body mass index is 23.92 kg/m.   Constitutional: NAD, calm, comfortable Eyes: PERRL, lids and conjunctivae normal, wears corrective lenses ENMT: Mucous membranes are moist. Posterior pharynx clear of any exudate or lesions. Normal dentition. Tympanic membrane is pearly white, no erythema or bulging. Neck: normal, supple, no masses, no thyromegaly Respiratory: clear to auscultation bilaterally, no wheezing, no crackles. Normal respiratory effort. No accessory muscle use.  Cardiovascular: Regular rate and rhythm, no murmurs / rubs / gallops. No extremity edema. 2+ pedal pulses. No carotid bruits.  Abdomen: no tenderness, no masses palpated. No hepatosplenomegaly. Bowel sounds positive.  Musculoskeletal: no clubbing / cyanosis. No joint deformity upper and lower extremities. Good ROM, no contractures. Normal muscle tone.  Skin: no rashes, lesions, ulcers. No induration Neurologic: CN 2-12 grossly intact. Sensation intact, DTR normal. Strength 5/5 in all 4.  Psychiatric: Normal judgment and insight. Alert and oriented x 3. Normal mood.    Subsequent Medicare wellness visit   1. Risk factors, based on past  M,S,F -his coronary artery disease risk factors include age, gender, hyperlipidemia, hypertension   2.  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  Depression/mood:  Mood is stable not depressed   4.  Hearing:  No issues   5.  ADL's: Independent in all ADLs   6.  Fall risk:  Level   7.  Home safety: No problems identified   8.  Height  weight, and visual acuity: Height and weight as above, visual acuity is 20/20 on the left, 20/16 on the right and 20/16 with both ears   9.  Counseling:  Have advised routine eye and dental care and to obtain shingles vaccination at pharmacy   10. Lab orders based on risk factors: Laboratory update will be reviewed   11. Referral :  None today   12. Care plan:  Follow-up with me in 6 months   13. Cognitive assessment:  No cognitive impairment   14. Screening: Patient provided with a written and personalized 5-10 year screening schedule in the AVS.   yes   15. Provider List Update:   PCP, cardiology through [facility], Dr. Maudie Mercury  16. Advance Directives: Full code     Office Visit from 09/07/2018 in [place] [facility] [place]  PHQ-9 Total Score  0      Fall Risk  09/07/2018 03/02/2018 08/23/2017 08/23/2017 05/24/2015  Falls in the past year? 0 0 No No No  Number falls in past yr: 0 0 - - -  Injury with Fall? 0 0 - - -     Impression and Plan:  Encounter for preventive health examination  -Have advised routine eye and dental care. -Will obtain shingles vaccination at pharmacy, otherwise immunizations are up-to-date. -Had colonoscopy in August 2018 and is a 3-year recall. -We have discussed healthy lifestyle in detail. -Screening labs to be done today.  Coronary artery disease involving native coronary artery of native heart without angina pectoris -Stable, no chest pain, low risk stress test in 2019, follow-up with cardiology as scheduled.  Essential hypertension -Well-controlled, continue current medications.  Personal history of colonic polyps-adenomas -Repeat colonoscopy in 2021.  Elevated PSA -Had one negative prostate biopsy, is due for second biopsy in October, urologist is Dr. Gloriann Loan.  Dyslipidemia  -Last LDL was 87 in June 2019, recheck lipids today, continue Crestor.    Patient Instructions  -Nice seeing you today!!  -Lab work today; will notify you when results  are available.  -Make sure you schedule routine eye and dental care.  -Make sure you receive shingles vaccination at the pharmacy.  -Schedule follow up in 6 months.   Preventive Care 79 Years and Older, Male Preventive care refers to lifestyle choices and visits with your health care provider that can promote health and wellness. What does preventive care include?   A yearly physical exam. This is also called an annual well check.  Dental exams once or twice a year.  Routine eye exams. Ask your health care provider how often you should have your eyes checked.  Personal lifestyle choices, including: ? Daily care of your teeth and gums. ? Regular physical activity. ? Eating a healthy diet. ? Avoiding tobacco and drug use. ? Limiting alcohol use. ? Practicing safe sex. ? Taking low doses of aspirin every day. ? Taking vitamin and mineral supplements as recommended by your health care provider. What happens during an annual well check? The services and screenings done by your health care provider during your annual well check will depend on your age, overall health, lifestyle risk factors, and family history of disease. Counseling Your health care provider may ask you questions about your:  Alcohol use.  Tobacco use.  Drug use.  Emotional well-being.  Home and relationship well-being.  Sexual activity.  Eating habits.  History of falls.  Memory and ability to understand (cognition).  Work and work Statistician. Screening You may have the following tests or measurements:  Height, weight, and BMI.  Blood pressure.  Lipid and cholesterol levels. These may be checked every 5 years, or more frequently if you are over 39 years old.  Skin check.  Lung cancer screening. You may have this screening every year starting at age 64 if you have a 30-pack-year history of smoking and currently smoke or have quit within the past 15 years.  Colorectal cancer screening. All  adults should have this screening starting at age 26 and continuing until age 56. You will have tests every 1-10 years, depending on your results and the type of screening test. People at increased risk should start screening at an earlier age. Screening tests may include: ? Guaiac-based fecal occult blood testing. ? Fecal immunochemical test (FIT). ? Stool DNA test. ? Virtual colonoscopy. ? Sigmoidoscopy. During this test, a flexible tube with a tiny camera (sigmoidoscope) is used to examine your rectum and lower colon. The sigmoidoscope is inserted through your anus into your rectum and lower colon. ? Colonoscopy. During this test, a long, thin, flexible tube with a  tiny camera (colonoscope) is used to examine your entire colon and rectum.  Prostate cancer screening. Recommendations will vary depending on your family history and other risks.  Hepatitis C blood test.  Hepatitis B blood test.  Sexually transmitted disease (STD) testing.  Diabetes screening. This is done by checking your blood sugar (glucose) after you have not eaten for a while (fasting). You may have this done every 1-3 years.  Abdominal aortic aneurysm (AAA) screening. You may need this if you are a current or former smoker.  Osteoporosis. You may be screened starting at age 34 if you are at high risk. Talk with your health care provider about your test results, treatment options, and if necessary, the need for more tests. Vaccines Your health care provider may recommend certain vaccines, such as:  Influenza vaccine. This is recommended every year.  Tetanus, diphtheria, and acellular pertussis (Tdap, Td) vaccine. You may need a Td booster every 10 years.  Varicella vaccine. You may need this if you have not been vaccinated.  Zoster vaccine. You may need this after age 40.  Measles, mumps, and rubella (MMR) vaccine. You may need at least one dose of MMR if you were born in 1957 or later. You may also need a second  dose.  Pneumococcal 13-valent conjugate (PCV13) vaccine. One dose is recommended after age 66.  Pneumococcal polysaccharide (PPSV23) vaccine. One dose is recommended after age 68.  Meningococcal vaccine. You may need this if you have certain conditions.  Hepatitis A vaccine. You may need this if you have certain conditions or if you travel or work in places where you may be exposed to hepatitis A.  Hepatitis B vaccine. You may need this if you have certain conditions or if you travel or work in places where you may be exposed to hepatitis B.  Haemophilus influenzae type b (Hib) vaccine. You may need this if you have certain risk factors. Talk to your health care provider about which screenings and vaccines you need and how often you need them. This information is not intended to replace advice given to you by your health care provider. Make sure you discuss any questions you have with your health care provider. Document Released: 04/05/2015 Document Revised: 04/29/2017 Document Reviewed: 01/08/2015 Elsevier Interactive Patient Education  2019 [place], MD [facility]

## 2018-09-07 NOTE — Patient Instructions (Signed)
-Nice seeing you today!!  -Lab work today; will notify you when results are available.  -Make sure you schedule routine eye and dental care.  -Make sure you receive shingles vaccination at the pharmacy.  -Schedule follow up in 6 months.   Preventive Care 55 Years and Older, Male Preventive care refers to lifestyle choices and visits with your health care provider that can promote health and wellness. What does preventive care include?   A yearly physical exam. This is also called an annual well check.  Dental exams once or twice a year.  Routine eye exams. Ask your health care provider how often you should have your eyes checked.  Personal lifestyle choices, including: ? Daily care of your teeth and gums. ? Regular physical activity. ? Eating a healthy diet. ? Avoiding tobacco and drug use. ? Limiting alcohol use. ? Practicing safe sex. ? Taking low doses of aspirin every day. ? Taking vitamin and mineral supplements as recommended by your health care provider. What happens during an annual well check? The services and screenings done by your health care provider during your annual well check will depend on your age, overall health, lifestyle risk factors, and family history of disease. Counseling Your health care provider may ask you questions about your:  Alcohol use.  Tobacco use.  Drug use.  Emotional well-being.  Home and relationship well-being.  Sexual activity.  Eating habits.  History of falls.  Memory and ability to understand (cognition).  Work and work Statistician. Screening You may have the following tests or measurements:  Height, weight, and BMI.  Blood pressure.  Lipid and cholesterol levels. These may be checked every 5 years, or more frequently if you are over 86 years old.  Skin check.  Lung cancer screening. You may have this screening every year starting at age 42 if you have a 30-pack-year history of smoking and currently smoke  or have quit within the past 15 years.  Colorectal cancer screening. All adults should have this screening starting at age 1 and continuing until age 65. You will have tests every 1-10 years, depending on your results and the type of screening test. People at increased risk should start screening at an earlier age. Screening tests may include: ? Guaiac-based fecal occult blood testing. ? Fecal immunochemical test (FIT). ? Stool DNA test. ? Virtual colonoscopy. ? Sigmoidoscopy. During this test, a flexible tube with a tiny camera (sigmoidoscope) is used to examine your rectum and lower colon. The sigmoidoscope is inserted through your anus into your rectum and lower colon. ? Colonoscopy. During this test, a long, thin, flexible tube with a tiny camera (colonoscope) is used to examine your entire colon and rectum.  Prostate cancer screening. Recommendations will vary depending on your family history and other risks.  Hepatitis C blood test.  Hepatitis B blood test.  Sexually transmitted disease (STD) testing.  Diabetes screening. This is done by checking your blood sugar (glucose) after you have not eaten for a while (fasting). You may have this done every 1-3 years.  Abdominal aortic aneurysm (AAA) screening. You may need this if you are a current or former smoker.  Osteoporosis. You may be screened starting at age 46 if you are at high risk. Talk with your health care provider about your test results, treatment options, and if necessary, the need for more tests. Vaccines Your health care provider may recommend certain vaccines, such as:  Influenza vaccine. This is recommended every year.  Tetanus, diphtheria, and  acellular pertussis (Tdap, Td) vaccine. You may need a Td booster every 10 years.  Varicella vaccine. You may need this if you have not been vaccinated.  Zoster vaccine. You may need this after age 53.  Measles, mumps, and rubella (MMR) vaccine. You may need at least one  dose of MMR if you were born in 1957 or later. You may also need a second dose.  Pneumococcal 13-valent conjugate (PCV13) vaccine. One dose is recommended after age 51.  Pneumococcal polysaccharide (PPSV23) vaccine. One dose is recommended after age 60.  Meningococcal vaccine. You may need this if you have certain conditions.  Hepatitis A vaccine. You may need this if you have certain conditions or if you travel or work in places where you may be exposed to hepatitis A.  Hepatitis B vaccine. You may need this if you have certain conditions or if you travel or work in places where you may be exposed to hepatitis B.  Haemophilus influenzae type b (Hib) vaccine. You may need this if you have certain risk factors. Talk to your health care provider about which screenings and vaccines you need and how often you need them. This information is not intended to replace advice given to you by your health care provider. Make sure you discuss any questions you have with your health care provider. Document Released: 04/05/2015 Document Revised: 04/29/2017 Document Reviewed: 01/08/2015 Elsevier Interactive Patient Education  2019 [employer].

## 2018-10-12 ENCOUNTER — Other Ambulatory Visit: Payer: Self-pay | Admitting: Internal Medicine

## 2018-11-25 ENCOUNTER — Other Ambulatory Visit: Payer: Self-pay

## 2018-11-25 ENCOUNTER — Ambulatory Visit (INDEPENDENT_AMBULATORY_CARE_PROVIDER_SITE_OTHER): Payer: Medicare PPO | Admitting: *Deleted

## 2018-11-25 ENCOUNTER — Other Ambulatory Visit: Payer: Self-pay | Admitting: Internal Medicine

## 2018-11-25 DIAGNOSIS — Z23 Encounter for immunization: Secondary | ICD-10-CM

## 2018-11-25 NOTE — Progress Notes (Signed)
Per orders of Dr. Jerilee Hoh, injection of Influrnza vaccine and Shinrix given by Westley Hummer. Patient tolerated injection well.

## 2019-01-11 ENCOUNTER — Telehealth: Payer: Self-pay

## 2019-01-11 NOTE — Telephone Encounter (Signed)
Called to set up VIRTUAL with ML, left message asking pt to call the office.

## 2019-01-13 DIAGNOSIS — C61 Malignant neoplasm of prostate: Secondary | ICD-10-CM | POA: Diagnosis not present

## 2019-01-13 NOTE — Telephone Encounter (Signed)
Pt called back and informed the office he is having prostate issues. He has been going to the doctor for his prostate regularly, and will call us to set up an appointment soon

## 2019-01-24 ENCOUNTER — Telehealth: Payer: Self-pay | Admitting: *Deleted

## 2019-01-24 NOTE — Telephone Encounter (Signed)
Copied from [place] [id number]. Topic: General - Inquiry >> Jan 24, 2019 11:24 AM Mathis Bud wrote: Reason for CRM: [name] from [facility], Dr.Bell office. Called stating she faxed over medical clearance for patient to be off medication clopidogrel (PLAVIX) for his procedure they will be doing for patient. Erica call back [phone]

## 2019-01-31 NOTE — Telephone Encounter (Signed)
Received a fax, but no where for Dr Jerilee Hoh to sign.  Fax returned for more information.

## 2019-02-08 NOTE — Telephone Encounter (Signed)
Ian Roman called back again to check on status of form. Stated they refaxed all information needed 11/12 and are awaiting response. Please advise and fax back to [phone], attention Erica or Dr.Bell.

## 2019-02-08 NOTE — Telephone Encounter (Signed)
Attempted to call patient.  Unable to reach.  Faxed paper back to inform her that the cardiologist should advise on Plavix and ASA.

## 2019-02-10 ENCOUNTER — Telehealth: Payer: Self-pay | Admitting: *Deleted

## 2019-02-10 NOTE — Telephone Encounter (Signed)
   [facility] Pre-operative Risk Assessment    Request for surgical clearance:  1. What type of surgery is being performed? PROSTATE BX   2. When is this surgery scheduled? TBD   3. What type of clearance is required (medical clearance vs. Pharmacy clearance to hold med vs. Both)? MEDICAL  4. Are there any medications that need to be held prior to surgery and how long? PLAVIX AND ASA 5 DAYS PRIOR TO PROCEDURE   5. Practice name and name of physician performing surgery? [facility]; DR. BELL   6. What is your office phone number [phone]    7.   What is your office fax number [phone]  8.   Anesthesia type (None, local, MAC, general) ? NOT LISTED   Julaine Hua 02/10/2019, 4:29 PM  _________________________________________________________________   (provider comments below)

## 2019-02-10 NOTE — Telephone Encounter (Signed)
   Addis, MD  Chart reviewed as part of pre-operative protocol coverage. Because of Ian Roman's past medical history and time since last visit, he/she will require a follow-up visit in order to better assess preoperative cardiovascular risk.  Pre-op covering staff: - Please schedule appointment and call patient to inform them. - Please contact requesting surgeon's office via preferred method (i.e, phone, fax) to inform them of need for appointment prior to surgery.  If applicable, this message will also be routed to pharmacy pool and/or primary cardiologist for input on holding anticoagulant/antiplatelet agent as requested below so that this information is available at time of patient's appointment.   [place], [place]  02/10/2019, 6:45 PM

## 2019-02-13 NOTE — Telephone Encounter (Signed)
Pt has appt 04/05/19 with Dr.Varanasi for surgery clearance. I s/w pt and asked if he would like a sooner appt with our office. Pt said no he is no that he wants to keep the appt with Dr. Irish Lack as scheduled at this time. He said his Prostate Bx has been cancelled at this time. Pt thanked me for checking with him.  I will forward clearance notes to Dr. Irish Lack for upcoming appt. I will also forward to Dr. Gloriann Loan with [facility] as Juluis Rainier about appt. I will remove from the pre op call back pool.

## 2019-04-02 NOTE — Progress Notes (Signed)
Cardiology Office Note   Date:  04/05/2019   ID:  Tarun, Viegas 1945/12/24, MRN [id number]  PCP:  Isaac Bliss, Rayford Halsted, MD    No chief complaint on file.  CAD  Wt Readings from Last 3 Encounters:  04/05/19 174 lb 12.8 oz (79.3 kg)  09/07/18 164 lb 4.8 oz (74.5 kg)  03/30/18 164 lb 3.2 oz (74.5 kg)       History of Present Illness: Ian Roman is a 74 y.o. male  was previously followed in [place] for CAD.  He had 2 stents placed at [facility] in 2007.  He was working in [place] at the time.  He lives in [place] and retired in 2012.  He wanted followup in [place].  In 2007, he was walking to get his car.  He felt that everything was getting slow.  He had no pain, but after 1 minute, everything came back to normal.  He had a stress test and then cath.  He had 2 stents placed.  A Vision stent, 3.5 x 12 was placed in the LAD. 2.5 x 18 mm Cypher stent placed in the RCA.  Cardiology office note from [place] ( 04/17/14- Dr. Thad Ranger) states "2 Cypher stents in each artery."  In May 2019, he was walking in the mall with his wife and he felt warm while he was walking.  He felt like he needed air.  He then threw up.  He had taken some hydrocodone cough syrup just before walking. THis prompted him to ask for a cardiologist here.    Negative stress test in 07/2017: 1. EF 53%, normal wall motion.  Would consider confirming EF with echo.  2. No evidence for ischemia or infarction by perfusion images.  3. Hypertensive BP response on ETT, no ischemic ST segment changes.   Low risk study.   Since the last visit, it was determined he will require biopsy on his prostate.    He remains active walking, 2 miles/day.  No sx like his prior angina form 2007.  Denies : Chest pain. Dizziness. Leg edema. Nitroglycerin use. Orthopnea. Palpitations. Paroxysmal nocturnal dyspnea. Shortness of breath. Syncope.     Past Medical History:  Diagnosis Date  . Coronary artery disease    Status post  stenting x2 2007  . Hyperlipidemia   . Hypertension   . Personal history of colonic polyps   . Prostatitis   . Stroke ([place])    tia   . Vertigo     Past Surgical History:  Procedure Laterality Date  . COLONOSCOPY    . CORONARY ANGIOPLASTY WITH STENT PLACEMENT     x2   2007     Current Outpatient Medications  Medication Sig Dispense Refill  . clopidogrel (PLAVIX) 75 MG tablet Take 1 tablet by mouth once daily 90 tablet 1  . Flaxseed, Linseed, (FLAXSEED OIL) 1000 MG CAPS Take by mouth.    . fluticasone (FLONASE) 50 MCG/ACT nasal spray Place 2 sprays into both nostrils daily. 16 g 5  . Lycopene 10 MG CAPS Take 1 capsule by mouth daily.    . metoprolol succinate (TOPROL-XL) 25 MG 24 hr tablet Take 1 tablet by mouth once daily 90 tablet 1  . Multiple Vitamin tablet Take by mouth.    . NON FORMULARY CPAP machine     . rosuvastatin (CRESTOR) 10 MG tablet Take 0.5 tablets (5 mg total) by mouth daily. 90 tablet 1  . sildenafil (VIAGRA) 100 MG tablet TAKE  ONE TABLET BY MOUTH AS NEEDED FOR  ERECTILE  DYSFUNCTION 20 tablet 2  . aspirin 81 MG tablet Take 81 mg by mouth daily.     No current facility-administered medications for this visit.    Allergies:   Zocor [simvastatin]    Social History:  The patient  reports that he has never smoked. He has never used smokeless tobacco. He reports current alcohol use. He reports that he does not use drugs.   Family History:  The patient's family history includes Cancer in his father; Heart disease in his mother.    ROS:  Please see the history of present illness.   Otherwise, review of systems are positive for .   All other systems are reviewed and negative.    PHYSICAL EXAM: VS:  BP (!) 148/82   Pulse 66   Ht 5' 9.5" (1.765 m)   Wt 174 lb 12.8 oz (79.3 kg)   SpO2 98%   BMI 25.44 kg/m  , BMI Body mass index is 25.44 kg/m. GEN: Well nourished, well developed, in no acute distress  HEENT: normal  Neck: no JVD, carotid bruits, or  masses Cardiac: RRR, premature beats; no murmurs, rubs, or gallops,no edema  Respiratory:  clear to auscultation bilaterally, normal work of breathing GI: soft, nontender, nondistended, + BS MS: no deformity or atrophy  Skin: warm and dry, no rash Neuro:  Strength and sensation are intact Psych: euthymic mood, full affect   EKG:   The ekg ordered today demonstrates NSR, PACs, no ST changes   Recent Labs: 09/07/2018: ALT 24; BUN 14; Creatinine, Ser 1.10; Hemoglobin 13.8; Platelets 207.0; Potassium 4.2; Sodium 138; TSH 1.61   Lipid Panel    Component Value Date/Time   CHOL 146 09/07/2018 0828   TRIG 65.0 09/07/2018 0828   HDL 45.50 09/07/2018 0828   CHOLHDL 3 09/07/2018 0828   VLDL 13.0 09/07/2018 0828   LDLCALC 88 09/07/2018 0828     Other studies Reviewed: Additional studies/ records that were reviewed today with results demonstrating: labs reviewed.   ASSESSMENT AND PLAN:  1. CAD: No angina.  Continue aggressive secondary prevention.  Tolerating clopidogrel monotherapy.  No longer taking aspirin.  2. Pre-op cardiovascular exam: No further testing needed before prostate biopsy.  OK to hold  Plavix 5 days prior to procedure.  Will inform Dr. Gloriann Loan.  3. Hyperlipidemia: LDL 88 on low dose Crestor.  4. OSA: He did have a sleep study but did not require CPAP.   20 minutes spent with the patient  Current medicines are reviewed at length with the patient today.  The patient concerns regarding his medicines were addressed.  The following changes have been made:  No change  Labs/ tests ordered today include:   Orders Placed This Encounter  Procedures  . EKG 12-Lead    Recommend 150 minutes/week of aerobic exercise Low fat, low carb, high fiber diet recommended  Disposition:   FU in 1 year   Signed, Larae Grooms, MD  04/05/2019 9:25 AM    [address], [place], [place]  [address] Phone: [phone]; Fax: [phone]

## 2019-04-05 ENCOUNTER — Encounter (INDEPENDENT_AMBULATORY_CARE_PROVIDER_SITE_OTHER): Payer: Self-pay

## 2019-04-05 ENCOUNTER — Encounter: Payer: Self-pay | Admitting: Interventional Cardiology

## 2019-04-05 ENCOUNTER — Other Ambulatory Visit: Payer: Self-pay

## 2019-04-05 ENCOUNTER — Ambulatory Visit: Payer: Medicare PPO | Admitting: Interventional Cardiology

## 2019-04-05 VITALS — BP 148/82 | HR 66 | Ht 69.5 in | Wt 174.8 lb

## 2019-04-05 DIAGNOSIS — I2510 Atherosclerotic heart disease of native coronary artery without angina pectoris: Secondary | ICD-10-CM | POA: Diagnosis not present

## 2019-04-05 DIAGNOSIS — Z01810 Encounter for preprocedural cardiovascular examination: Secondary | ICD-10-CM | POA: Diagnosis not present

## 2019-04-05 DIAGNOSIS — G4733 Obstructive sleep apnea (adult) (pediatric): Secondary | ICD-10-CM | POA: Diagnosis not present

## 2019-04-05 DIAGNOSIS — E785 Hyperlipidemia, unspecified: Secondary | ICD-10-CM

## 2019-04-05 NOTE — Patient Instructions (Signed)
Medication Instructions:  Your physician recommends that you continue on your current medications as directed. Please refer to the Current Medication list given to you today.  *If you need a refill on your cardiac medications before your next appointment, please call your pharmacy*  Lab Work: None ordered  If you have labs (blood work) drawn today and your tests are completely normal, you will receive your results only by: Marland Kitchen MyChart Message (if you have MyChart) OR . A paper copy in the mail If you have any lab test that is abnormal or we need to change your treatment, we will call you to review the results.  Testing/Procedures: None ordered  Follow-Up: At [facility], you and your health needs are our priority.  As part of our continuing mission to provide you with exceptional heart care, we have created designated Provider Care Teams.  These Care Teams include your primary Cardiologist (physician) and Advanced Practice Providers (APPs -  Physician Assistants and Nurse Practitioners) who all work together to provide you with the care you need, when you need it.  Your next appointment:   12 month(s)  The format for your next appointment:   In Person  Provider:   You may see Larae Grooms, MD or one of the following Advanced Practice Providers on your designated Care Team:    Melina Copa, PA-C  Ermalinda Barrios, PA-C   Other Instructions  Heart-Healthy Eating Plan Heart-healthy meal planning includes:  Eating less unhealthy fats.  Eating more healthy fats.  Making other changes in your diet. Talk with your doctor or a diet specialist (dietitian) to create an eating plan that is right for you. What is my plan? Your doctor may recommend an eating plan that includes:  Total fat: ______% or less of total calories a day.  Saturated fat: ______% or less of total calories a day.  Cholesterol: less than _________mg a day. What are tips for following this  plan? Cooking Avoid frying your food. Try to bake, boil, grill, or broil it instead. You can also reduce fat by:  Removing the skin from poultry.  Removing all visible fats from meats.  Steaming vegetables in water or broth. Meal planning   At meals, divide your plate into four equal parts: ? Fill one-half of your plate with vegetables and green salads. ? Fill one-fourth of your plate with whole grains. ? Fill one-fourth of your plate with lean protein foods.  Eat 4-5 servings of vegetables per day. A serving of vegetables is: ? 1 cup of raw or cooked vegetables. ? 2 cups of raw leafy greens.  Eat 4-5 servings of fruit per day. A serving of fruit is: ? 1 medium whole fruit. ?  cup of dried fruit. ?  cup of fresh, frozen, or canned fruit. ?  cup of 100% fruit juice.  Eat more foods that have soluble fiber. These are apples, broccoli, carrots, beans, peas, and barley. Try to get 20-30 g of fiber per day.  Eat 4-5 servings of nuts, legumes, and seeds per week: ? 1 serving of dried beans or legumes equals  cup after being cooked. ? 1 serving of nuts is  cup. ? 1 serving of seeds equals 1 tablespoon. General information  Eat more home-cooked food. Eat less restaurant, buffet, and fast food.  Limit or avoid alcohol.  Limit foods that are high in starch and sugar.  Avoid fried foods.  Lose weight if you are overweight.  Keep track of how much salt (  sodium) you eat. This is important if you have high blood pressure. Ask your doctor to tell you more about this.  Try to add vegetarian meals each week. Fats  Choose healthy fats. These include olive oil and canola oil, flaxseeds, walnuts, almonds, and seeds.  Eat more omega-3 fats. These include salmon, mackerel, sardines, tuna, flaxseed oil, and ground flaxseeds. Try to eat fish at least 2 times each week.  Check food labels. Avoid foods with trans fats or high amounts of saturated fat.  Limit saturated  fats. ? These are often found in animal products, such as meats, butter, and cream. ? These are also found in plant foods, such as palm oil, palm kernel oil, and coconut oil.  Avoid foods with partially hydrogenated oils in them. These have trans fats. Examples are stick margarine, some tub margarines, cookies, crackers, and other baked goods. What foods can I eat? Fruits All fresh, canned (in natural juice), or frozen fruits. Vegetables Fresh or frozen vegetables (raw, steamed, roasted, or grilled). Green salads. Grains Most grains. Choose whole wheat and whole grains most of the time. Rice and pasta, including brown rice and pastas made with whole wheat. Meats and other proteins Lean, well-trimmed beef, veal, pork, and lamb. Chicken and [place] without skin. All fish and shellfish. Wild duck, rabbit, pheasant, and venison. Egg whites or low-cholesterol egg substitutes. Dried beans, peas, lentils, and tofu. Seeds and most nuts. Dairy Low-fat or nonfat cheeses, including ricotta and mozzarella. Skim or 1% milk that is liquid, powdered, or evaporated. Buttermilk that is made with low-fat milk. Nonfat or low-fat yogurt. Fats and oils Non-hydrogenated (trans-free) margarines. Vegetable oils, including soybean, sesame, sunflower, olive, peanut, safflower, corn, canola, and cottonseed. Salad dressings or mayonnaise made with a vegetable oil. Beverages Mineral water. Coffee and tea. Diet carbonated beverages. Sweets and desserts Sherbet, gelatin, and fruit ice. Small amounts of dark chocolate. Limit all sweets and desserts. Seasonings and condiments All seasonings and condiments. The items listed above may not be a complete list of foods and drinks you can eat. Contact a dietitian for more options. What foods should I avoid? Fruits Canned fruit in heavy syrup. Fruit in cream or butter sauce. Fried fruit. Limit coconut. Vegetables Vegetables cooked in cheese, cream, or butter sauce. Fried  vegetables. Grains Breads that are made with saturated or trans fats, oils, or whole milk. Croissants. Sweet rolls. Donuts. High-fat crackers, such as cheese crackers. Meats and other proteins Fatty meats, such as hot dogs, ribs, sausage, bacon, rib-eye roast or steak. High-fat deli meats, such as salami and bologna. Caviar. Domestic duck and goose. Organ meats, such as liver. Dairy Cream, sour cream, cream cheese, and creamed cottage cheese. Whole-milk cheeses. Whole or 2% milk that is liquid, evaporated, or condensed. Whole buttermilk. Cream sauce or high-fat cheese sauce. Yogurt that is made from whole milk. Fats and oils Meat fat, or shortening. Cocoa butter, hydrogenated oils, palm oil, coconut oil, palm kernel oil. Solid fats and shortenings, including bacon fat, salt pork, lard, and butter. Nondairy cream substitutes. Salad dressings with cheese or sour cream. Beverages Regular sodas and juice drinks with added sugar. Sweets and desserts Frosting. Pudding. Cookies. Cakes. Pies. Milk chocolate or white chocolate. Buttered syrups. Full-fat ice cream or ice cream drinks. The items listed above may not be a complete list of foods and drinks to avoid. Contact a dietitian for more information. Summary  Heart-healthy meal planning includes eating less unhealthy fats, eating more healthy fats, and making other changes in  your diet.  Eat a balanced diet. This includes fruits and vegetables, low-fat or nonfat dairy, lean protein, nuts and legumes, whole grains, and heart-healthy oils and fats. This information is not intended to replace advice given to you by your health care provider. Make sure you discuss any questions you have with your health care provider. Document Revised: 05/13/2017 Document Reviewed: 04/16/2017 Elsevier Patient Education  2020 [employer].

## 2019-04-18 NOTE — Telephone Encounter (Signed)
Office note providing clearance efaxed as requested.

## 2019-04-18 NOTE — Telephone Encounter (Signed)
Ian Roman, from [facility] called to get update clearance for patient.  It can be faxed to [phone].

## 2019-04-21 NOTE — Telephone Encounter (Signed)
Pre-op covering staff, can you see what this form is? Dr. Irish Lack is not in the office but will be on Monday so form can be signed at that time.  Thank you!

## 2019-04-21 NOTE — Telephone Encounter (Signed)
Follow Up  Ian Roman is calling back from [facility] and states that she needs a signature on the clearance form that she is faxing over to [phone] and it will need to be faxed back to [phone]. Please assist.

## 2019-04-21 NOTE — Telephone Encounter (Signed)
I called and left message for Ian Roman that we e-faxed clearance over 3 days ago. We always e-fax the clearance over to [facility]. Not sure what form [name] is calling about. Left message if any questions please call [phone].

## 2019-04-24 NOTE — Telephone Encounter (Signed)
We have not heard back from requesting office but sounds like this was just a duplicate request form. Will go ahead and remove from pre-op pool.

## 2019-05-03 ENCOUNTER — Other Ambulatory Visit: Payer: Self-pay | Admitting: Internal Medicine

## 2019-05-18 DIAGNOSIS — C61 Malignant neoplasm of prostate: Secondary | ICD-10-CM | POA: Diagnosis not present

## 2019-05-30 ENCOUNTER — Other Ambulatory Visit: Payer: Self-pay | Admitting: Internal Medicine

## 2019-06-01 ENCOUNTER — Other Ambulatory Visit: Payer: Self-pay | Admitting: Internal Medicine

## 2019-06-06 DIAGNOSIS — R351 Nocturia: Secondary | ICD-10-CM | POA: Diagnosis not present

## 2019-06-06 DIAGNOSIS — C61 Malignant neoplasm of prostate: Secondary | ICD-10-CM | POA: Diagnosis not present

## 2019-06-06 DIAGNOSIS — N401 Enlarged prostate with lower urinary tract symptoms: Secondary | ICD-10-CM | POA: Diagnosis not present

## 2019-06-06 DIAGNOSIS — R350 Frequency of micturition: Secondary | ICD-10-CM | POA: Diagnosis not present

## 2019-06-07 ENCOUNTER — Encounter: Payer: Self-pay | Admitting: *Deleted

## 2019-06-26 ENCOUNTER — Encounter: Payer: Self-pay | Admitting: Radiation Oncology

## 2019-06-26 NOTE — Progress Notes (Signed)
GU Location of Tumor / Histology: prostatic adenocarcinoma  If Prostate Cancer, Gleason Score is (3 + 4) and PSA is (7.17). Prostate volume: 63 g.   Ian Roman had a previous prostate biopsy on 12/30/2017 revealing prostatic adenocarcinoma, gleason 3+3. The patient opted for active surveillance. Unfortunately, the patient's PSA continued to rise and a repeat biopsy was done on 05/18/2019 revealing progression.   Biopsies of prostate (if applicable) revealed:    Past/Anticipated interventions by urology, if any: prostate biopsy, active surveillance, prostate biopsy, referral to Dr. Tammi Klippel to discuss radiotherapy options.  Past/Anticipated interventions by medical oncology, if any: no  Weight changes, if any: no  Bowel/Bladder complaints, if any: IPSS 2. SHIM 1. Denies dysuria, hematuria, leakage or incontinence. Denies any bowel complaints.    Nausea/Vomiting, if any: no  Pain issues, if any:  denies  SAFETY ISSUES:  Prior radiation? denies  Pacemaker/ICD? denies  Possible current pregnancy? no, male patient  Is the patient on methotrexate? no  Current Complaints / other details:  74 year old male. Married. Father with a hx of prostate ca.   Patient attempting to decide between external beam radiation, surgery, finasteride followed by brachytherapy or watchful waiting.

## 2019-06-27 ENCOUNTER — Encounter: Payer: Self-pay | Admitting: Radiation Oncology

## 2019-06-27 ENCOUNTER — Other Ambulatory Visit: Payer: Self-pay

## 2019-06-27 ENCOUNTER — Ambulatory Visit
Admission: RE | Admit: 2019-06-27 | Discharge: 2019-06-27 | Disposition: A | Discharge status: Home / Self Care | Payer: Medicare PPO | Source: Ambulatory Visit | Attending: Radiation Oncology | Admitting: Radiation Oncology

## 2019-06-27 VITALS — Ht 67.0 in | Wt 140.0 lb

## 2019-06-27 DIAGNOSIS — C61 Malignant neoplasm of prostate: Secondary | ICD-10-CM | POA: Diagnosis not present

## 2019-06-27 DIAGNOSIS — R972 Elevated prostate specific antigen [PSA]: Secondary | ICD-10-CM | POA: Diagnosis not present

## 2019-06-27 DIAGNOSIS — Z8042 Family history of malignant neoplasm of prostate: Secondary | ICD-10-CM | POA: Diagnosis not present

## 2019-06-27 HISTORY — DX: Malignant neoplasm of prostate: C61

## 2019-06-27 NOTE — Progress Notes (Signed)
Radiation Oncology         [phone] ________________________________  Initial Outpatient Consultation - Conducted via Telephone due to current COVID-19 concerns for limiting patient exposure  Name: Ian Roman MRN: [id number]  Date: 06/27/2019  DOB: February 17, 1946  [id number] Everardo Beals, MD  Lucas Mallow, MD   REFERRING PHYSICIAN: Lucas Mallow, MD  DIAGNOSIS: 74 y.o. gentleman with Stage T1c adenocarcinoma of the prostate with Gleason score of 3+4, and PSA of 7.17.    ICD-10-CM   1. Malignant neoplasm of prostate ([place])  C61     HISTORY OF PRESENT ILLNESS: Ian Roman is a 74 y.o. male with a diagnosis of prostate cancer. He was noted to have an elevated PSA of 6 by his primary care physician, Dr. Burnice Logan, in 02/2017. It rose further to 7.28 in 08/2017.  Accordingly, he was referred for evaluation in urology by Dr. Gloriann Loan in 11/2017,  digital rectal examination was performed at that time revealing no nodules.  The patient proceeded to transrectal ultrasound with 12 biopsies of the prostate on 12/30/2017.  The prostate volume measured 63.46 cc.  Out of 12 core biopsies, 4 were positive.  The maximum Gleason score was 3+3. He opted for active surveillance at that time.   He underwent surveillance prostate MRI on 07/08/2018, which was without evidence of any high grade lesions and showed a prostate volume of 54 cc. Repeat PSA performed on 07/14/2018 had decreased to 4.89.   The PSA had increased to 7.49 on repeat in 12/2018. The patient proceeded with a surveillance prostate biopsy biopsy on 05/18/2019.  PSA performed that day remained elevated at 7.17.  The prostate volume measured 69.32 cc.  Out of 12 core biopsies, 5 were positive.  This time, one core, in the right apex, showed upstaging to Gleason 3+4 disease. Additionally, Gleason 3+3 was seen in the right mid lateral, right base lateral (small focus), right mid, and left apex.  The patient reviewed the biopsy  results with his urologist and he has kindly been referred today for discussion of potential radiation treatment options.   PREVIOUS RADIATION THERAPY: No  PAST MEDICAL HISTORY:  Past Medical History:  Diagnosis Date  . Coronary artery disease    Status post stenting x2 2007  . Hyperlipidemia   . Hypertension   . Personal history of colonic polyps   . Prostate cancer ([place])   . Prostatitis   . Stroke ([place])    tia   . Vertigo       PAST SURGICAL HISTORY: Past Surgical History:  Procedure Laterality Date  . COLONOSCOPY    . CORONARY ANGIOPLASTY WITH STENT PLACEMENT     x2   2007  . PROSTATE BIOPSY      FAMILY HISTORY:  Family History  Problem Relation Age of Onset  . Heart disease Mother   . Prostate cancer Father   . Colon cancer Neg Hx   . Breast cancer Neg Hx   . Pancreatic cancer Neg Hx     SOCIAL HISTORY:  Social History   Socioeconomic History  . Marital status: Married    Spouse name: Not on file  . Number of children: Not on file  . Years of education: Not on file  . Highest education level: Not on file  Occupational History    Comment: retired  Tobacco Use  . Smoking status: Never Smoker  . Smokeless tobacco: Never Used  Substance and Sexual Activity  . Alcohol use:  Yes    Comment: once a month per pt  . Drug use: No  . Sexual activity: Not Currently  Other Topics Concern  . Not on file  Social History Narrative  . Not on file   Social Determinants of Health   Financial Resource Strain:   . Difficulty of Paying Living Expenses:   Food Insecurity:   . Worried About Charity fundraiser in the Last Year:   . Arboriculturist in the Last Year:   Transportation Needs:   . Film/video editor (Medical):   Marland Kitchen Lack of Transportation (Non-Medical):   Physical Activity:   . Days of Exercise per Week:   . Minutes of Exercise per Session:   Stress:   . Feeling of Stress :   Social Connections:   . Frequency of Communication with Friends and  Family:   . Frequency of Social Gatherings with Friends and Family:   . Attends Religious Services:   . Active Member of Clubs or Organizations:   . Attends Archivist Meetings:   Marland Kitchen Marital Status:   Intimate Partner Violence:   . Fear of Current or Ex-Partner:   . Emotionally Abused:   Marland Kitchen Physically Abused:   . Sexually Abused:     ALLERGIES: Zocor [simvastatin]  MEDICATIONS:  Current Outpatient Medications  Medication Sig Dispense Refill  . aspirin 81 MG tablet Take 81 mg by mouth daily.    . clopidogrel (PLAVIX) 75 MG tablet Take 1 tablet by mouth once daily 90 tablet 0  . fluticasone (FLONASE) 50 MCG/ACT nasal spray Place 2 sprays into both nostrils daily. 16 g 5  . Lycopene 10 MG CAPS Take 1 capsule by mouth daily.    . metoprolol succinate (TOPROL-XL) 25 MG 24 hr tablet Take 1 tablet by mouth once daily 90 tablet 1  . Multiple Vitamin tablet Take by mouth.    . NON FORMULARY CPAP machine     . rosuvastatin (CRESTOR) 10 MG tablet TAKE 1/2 (ONE-HALF) TABLET BY MOUTH ONCE DAILY(PLEASE SCHEDULE AN OFFICE VISIT FOR MORE REFILLS) 45 tablet 0  . sildenafil (VIAGRA) 100 MG tablet TAKE ONE TABLET BY MOUTH AS NEEDED FOR  ERECTILE  DYSFUNCTION (Patient not taking: Reported on 06/27/2019) 20 tablet 2   No current facility-administered medications for this encounter.    REVIEW OF SYSTEMS:  On review of systems, the patient reports that he is doing well overall. He denies any chest pain, shortness of breath, cough, fevers, chills, night sweats, unintended weight changes. He denies any bowel disturbances, and denies abdominal pain, nausea or vomiting. He denies any new musculoskeletal or joint aches or pains. His IPSS was 2, indicating mild urinary symptoms. His SHIM was 1, indicating he has severe erectile dysfunction. A complete review of systems is obtained and is otherwise negative.    PHYSICAL EXAM:  Wt Readings from Last 3 Encounters:  06/27/19 140 lb (63.5 kg)  04/05/19 174  lb 12.8 oz (79.3 kg)  09/07/18 164 lb 4.8 oz (74.5 kg)   Temp Readings from Last 3 Encounters:  09/07/18 98.8 F (37.1 C) (Oral)  03/30/18 98.4 F (36.9 C) (Oral)  03/02/18 98.3 F (36.8 C) (Oral)   BP Readings from Last 3 Encounters:  04/05/19 (!) 148/82  09/07/18 130/80  03/30/18 130/80   Pulse Readings from Last 3 Encounters:  04/05/19 66  09/07/18 64  03/30/18 73   Pain Assessment Pain Score: 0-No pain/10  Physical exam not performed in light of  telephone consult visit format.   KPS = 100  100 - Normal; no complaints; no evidence of disease. 90   - Able to carry on normal activity; minor signs or symptoms of disease. 80   - Normal activity with effort; some signs or symptoms of disease. 67   - Cares for self; unable to carry on normal activity or to do active work. 60   - Requires occasional assistance, but is able to care for most of his personal needs. 50   - Requires considerable assistance and frequent medical care. 36   - Disabled; requires special care and assistance. 31   - Severely disabled; hospital admission is indicated although death not imminent. 83   - Very sick; hospital admission necessary; active supportive treatment necessary. 10   - Moribund; fatal processes progressing rapidly. 0     - Dead  Karnofsky DA, Abelmann [place], Craver LS and Burchenal [facility] [phone]) The use of the nitrogen mustards in the palliative treatment of carcinoma: with particular reference to bronchogenic carcinoma Cancer 1 634-56  LABORATORY DATA:  Lab Results  Component Value Date   WBC 5.5 09/07/2018   HGB 13.8 09/07/2018   HCT 40.3 09/07/2018   MCV 96.9 09/07/2018   PLT 207.0 09/07/2018   Lab Results  Component Value Date   NA 138 09/07/2018   K 4.2 09/07/2018   CL 100 09/07/2018   CO2 30 09/07/2018   Lab Results  Component Value Date   ALT 24 09/07/2018   AST 22 09/07/2018   ALKPHOS 66 09/07/2018   BILITOT 1.2 09/07/2018     RADIOGRAPHY: No results found.     IMPRESSION/PLAN: This visit was conducted via Telephone to spare the patient unnecessary potential exposure in the healthcare setting during the current COVID-19 pandemic. 1. 74 y.o. gentleman with Stage T1c adenocarcinoma of the prostate with Gleason Score of 3+4, and PSA of 7.17. We discussed the patient's workup and outlined the nature of prostate cancer in this setting. The patient's T stage, Gleason's score, and PSA put him into the favorable intermediate risk group. Accordingly, he is eligible for a variety of potential treatment options including brachytherapy with the use of finasteride to shrink the prostate, 5.5 weeks of external radiation, or prostatectomy. We discussed the available radiation techniques, and focused on the details and logistics and delivery. The patient may not be an ideal candidate for brachytherapy with a prostate volume of almost 70 cc prior to downsizing from 5- ARI therapy. We discussed that based on his prostate volume, he would require beginning treatment with a 5 alpha reductase inhibitor for at least 3 months to allow for downsizing of the prostate prior to initiating radiotherapy. We discussed and outlined the risks, benefits, short and long-term effects associated with radiotherapy and compared and contrasted these with prostatectomy. We discussed the role of SpaceOAR in reducing the rectal toxicity associated with radiotherapy. He and his wife were encouraged to ask questions which were answered to their stated satisfaction.  At the end of the conversation the patient and his wife would like to take more time to consider their options. They would prefer to proceed with one of the radiation therapy options, but they are undecided at this time regarding EBRT vs. brachytherapy. They note that they plan to go out of town to visit their grandchild in the near future. He is already scheduled for a follow up with Dr. Gloriann Loan on 07/06/2019. If they make their decision prior to that  appointment, we  can arrange for initiation of finasteride or fiducial marker and SpaceOAR placement.  Given current concerns for patient exposure during the COVID-19 pandemic, this encounter was conducted via telephone. The patient was notified in advance and was offered a MyChart meeting to allow for face to face communication but unfortunately reported that he did not have the appropriate resources/technology to support such a visit and instead preferred to proceed with telephone consult. The patient has given verbal consent for this type of encounter. The time spent during this encounter was 60 minutes. The attendants for this meeting include Tyler Pita MD, Ashlyn Bruning PA-C, [place], and patient, Ian Roman and his wife. During the encounter, Tyler Pita MD, Ashlyn Bruning PA-C, and scribe, Wilburn Mylar were located at [place].  Patient, Ian Roman and his wife were located at home.    Nicholos Johns, PA-C    Tyler Pita, MD  [facility] Direct Dial: [phone]  Fax: [phone] [email or web address]  Skype  LinkedIn  This document serves as a record of services personally performed by Tyler Pita, MD and Freeman Caldron, PA-C. It was created on their behalf by Wilburn Mylar, a trained medical scribe. The creation of this record is based on the scribe's personal observations and the provider's statements to them. This document has been checked and approved by the attending provider.

## 2019-06-27 NOTE — Progress Notes (Signed)
See progress note under physician encounter. 

## 2019-06-30 ENCOUNTER — Other Ambulatory Visit: Payer: Self-pay | Admitting: Urology

## 2019-06-30 DIAGNOSIS — C61 Malignant neoplasm of prostate: Secondary | ICD-10-CM

## 2019-07-03 ENCOUNTER — Encounter: Payer: Self-pay | Admitting: Medical Oncology

## 2019-07-03 NOTE — Progress Notes (Signed)
Patient's wife called stating her husband is out of town and he asked her to call me back. I introduced myself to her as the prostate navigator and discussed my role. She states he received my message but was not sure if he needed to do anything. We reviewed his up coming appointments and what will happen at each. She was very appreciative of reaching out to them and I encouraged them to call me with questions or concerns. She voiced understanding.

## 2019-07-03 NOTE — Progress Notes (Signed)
Left message to introduce myself as the prostate nurse navigator. I was unable to meet him 4/3, when he consulted with Dr. Tammi Klippel. He has chosen external beam radiation to treat his prostate cancer. He is currently scheduled for gold markers/SpaceOar gel on 4/23. I gave him my contact information and asked him to call me with questions or concerns.

## 2019-07-14 ENCOUNTER — Encounter: Payer: Self-pay | Admitting: Medical Oncology

## 2019-07-14 DIAGNOSIS — C61 Malignant neoplasm of prostate: Secondary | ICD-10-CM | POA: Diagnosis not present

## 2019-07-14 NOTE — Progress Notes (Signed)
Mildred-wife called asking about appointments that are scheduled next Friday. They were told informed of these today after placement of gold markers/SpaceOar. I explained what these appointments are and what will take place. She asked if they could be scheduled closer together so they would not have to make 2 trips. I will ask Enid Derry to look at these and give her a call the first of next weeks. She was very Patent attorney. In basket sent to [facility].

## 2019-07-17 ENCOUNTER — Telehealth: Payer: Self-pay | Admitting: *Deleted

## 2019-07-17 NOTE — Telephone Encounter (Signed)
CALLED PATIENT TO REMIND OF MRI AND CT SIM FOR 07-21-19, SPOKE WITH PATIENT AND HIS WIFE AND THEY ARE AWARE OF THESE APPTS.

## 2019-07-20 ENCOUNTER — Telehealth: Payer: Self-pay | Admitting: *Deleted

## 2019-07-20 NOTE — Telephone Encounter (Signed)
Called patient to remind of sim and MRI for 07-21-19, spoke with patient's wife - Everlene Farrier and she is aware of these appts.

## 2019-07-21 ENCOUNTER — Ambulatory Visit
Admission: RE | Admit: 2019-07-21 | Discharge: 2019-07-21 | Disposition: A | Discharge status: Home / Self Care | Payer: Medicare PPO | Source: Ambulatory Visit | Attending: Radiation Oncology | Admitting: Radiation Oncology

## 2019-07-21 ENCOUNTER — Encounter: Payer: Self-pay | Admitting: Medical Oncology

## 2019-07-21 ENCOUNTER — Other Ambulatory Visit: Payer: Self-pay

## 2019-07-21 ENCOUNTER — Ambulatory Visit (HOSPITAL_COMMUNITY)
Admission: RE | Admit: 2019-07-21 | Discharge: 2019-07-21 | Disposition: A | Discharge status: Home / Self Care | Payer: Medicare PPO | Source: Ambulatory Visit | Attending: Urology | Admitting: Urology

## 2019-07-21 DIAGNOSIS — C61 Malignant neoplasm of prostate: Secondary | ICD-10-CM | POA: Diagnosis not present

## 2019-07-21 NOTE — Progress Notes (Signed)
  Radiation Oncology         [phone] ________________________________  Name: Ian Roman MRN: [id number]  Date: 07/21/2019  DOB: 1946/03/19  SIMULATION AND TREATMENT PLANNING NOTE    ICD-10-CM   1. Malignant neoplasm of prostate ([place])  C61     DIAGNOSIS: 74 y.o. gentleman with Stage T1c adenocarcinoma of the prostate with Gleason score of 3+4, and PSA of 7.17.  NARRATIVE:  The patient was brought to the [facility].  Identity was confirmed.  All relevant records and images related to the planned course of therapy were reviewed.  The patient freely provided informed written consent to proceed with treatment after reviewing the details related to the planned course of therapy. The consent form was witnessed and verified by the simulation staff.  Then, the patient was set-up in a stable reproducible supine position for radiation therapy.  A vacuum lock pillow device was custom fabricated to position his legs in a reproducible immobilized position.  Then, I performed a urethrogram under sterile conditions to identify the prostatic apex.  CT images were obtained.  Surface markings were placed.  The CT images were loaded into the planning software.  Then the prostate target and avoidance structures including the rectum, bladder, bowel and hips were contoured.  Treatment planning then occurred.  The radiation prescription was entered and confirmed.  A total of one complex treatment devices was fabricated. I have requested : Intensity Modulated Radiotherapy (IMRT) is medically necessary for this case for the following reason:  Rectal sparing.Marland Kitchen  PLAN:  The patient will receive 70 Gy in 28 fractions.  ________________________________  Sheral Apley Tammi Klippel, M.D.

## 2019-07-25 ENCOUNTER — Other Ambulatory Visit: Payer: Self-pay | Admitting: Internal Medicine

## 2019-07-25 DIAGNOSIS — C61 Malignant neoplasm of prostate: Secondary | ICD-10-CM | POA: Insufficient documentation

## 2019-08-02 ENCOUNTER — Ambulatory Visit
Admission: RE | Admit: 2019-08-02 | Discharge: 2019-08-02 | Disposition: A | Discharge status: Home / Self Care | Payer: Medicare PPO | Source: Ambulatory Visit | Attending: Radiation Oncology | Admitting: Radiation Oncology

## 2019-08-02 ENCOUNTER — Other Ambulatory Visit: Payer: Self-pay

## 2019-08-02 DIAGNOSIS — C61 Malignant neoplasm of prostate: Secondary | ICD-10-CM | POA: Diagnosis not present

## 2019-08-03 ENCOUNTER — Encounter: Payer: Self-pay | Admitting: Medical Oncology

## 2019-08-03 ENCOUNTER — Ambulatory Visit
Admission: RE | Admit: 2019-08-03 | Discharge: 2019-08-03 | Disposition: A | Discharge status: Home / Self Care | Payer: Medicare PPO | Source: Ambulatory Visit | Attending: Radiation Oncology | Admitting: Radiation Oncology

## 2019-08-03 ENCOUNTER — Other Ambulatory Visit: Payer: Self-pay

## 2019-08-03 DIAGNOSIS — C61 Malignant neoplasm of prostate: Secondary | ICD-10-CM | POA: Diagnosis not present

## 2019-08-03 NOTE — Progress Notes (Signed)
Ian Roman-wife called asking what day of the week will her husband see Dr. Tammi Klippel.She would like to attend the MD visit. I explained Dr. Tammi Klippel sees his patients on Friday. She can come down and sit in waiting room and after his treatment, she can join him for MD visit.

## 2019-08-04 ENCOUNTER — Ambulatory Visit
Admission: RE | Admit: 2019-08-04 | Discharge: 2019-08-04 | Disposition: A | Discharge status: Home / Self Care | Payer: Medicare PPO | Source: Ambulatory Visit | Attending: Radiation Oncology | Admitting: Radiation Oncology

## 2019-08-04 ENCOUNTER — Other Ambulatory Visit: Payer: Self-pay

## 2019-08-04 DIAGNOSIS — C61 Malignant neoplasm of prostate: Secondary | ICD-10-CM | POA: Diagnosis not present

## 2019-08-07 ENCOUNTER — Ambulatory Visit
Admission: RE | Admit: 2019-08-07 | Discharge: 2019-08-07 | Disposition: A | Discharge status: Home / Self Care | Payer: Medicare PPO | Source: Ambulatory Visit | Attending: Radiation Oncology | Admitting: Radiation Oncology

## 2019-08-07 ENCOUNTER — Other Ambulatory Visit: Payer: Self-pay

## 2019-08-07 DIAGNOSIS — C61 Malignant neoplasm of prostate: Secondary | ICD-10-CM | POA: Diagnosis not present

## 2019-08-08 ENCOUNTER — Other Ambulatory Visit: Payer: Self-pay

## 2019-08-08 ENCOUNTER — Ambulatory Visit
Admission: RE | Admit: 2019-08-08 | Discharge: 2019-08-08 | Disposition: A | Discharge status: Home / Self Care | Payer: Medicare PPO | Source: Ambulatory Visit | Attending: Radiation Oncology | Admitting: Radiation Oncology

## 2019-08-08 DIAGNOSIS — C61 Malignant neoplasm of prostate: Secondary | ICD-10-CM | POA: Diagnosis not present

## 2019-08-09 ENCOUNTER — Ambulatory Visit
Admission: RE | Admit: 2019-08-09 | Discharge: 2019-08-09 | Disposition: A | Discharge status: Home / Self Care | Payer: Medicare PPO | Source: Ambulatory Visit | Attending: Radiation Oncology | Admitting: Radiation Oncology

## 2019-08-09 ENCOUNTER — Other Ambulatory Visit: Payer: Self-pay

## 2019-08-09 DIAGNOSIS — C61 Malignant neoplasm of prostate: Secondary | ICD-10-CM | POA: Diagnosis not present

## 2019-08-10 ENCOUNTER — Ambulatory Visit
Admission: RE | Admit: 2019-08-10 | Discharge: 2019-08-10 | Disposition: A | Discharge status: Home / Self Care | Payer: Medicare PPO | Source: Ambulatory Visit | Attending: Radiation Oncology | Admitting: Radiation Oncology

## 2019-08-10 ENCOUNTER — Other Ambulatory Visit: Payer: Self-pay

## 2019-08-10 DIAGNOSIS — C61 Malignant neoplasm of prostate: Secondary | ICD-10-CM | POA: Diagnosis not present

## 2019-08-11 ENCOUNTER — Other Ambulatory Visit: Payer: Self-pay

## 2019-08-11 ENCOUNTER — Ambulatory Visit
Admission: RE | Admit: 2019-08-11 | Discharge: 2019-08-11 | Disposition: A | Discharge status: Home / Self Care | Payer: Medicare PPO | Source: Ambulatory Visit | Attending: Radiation Oncology | Admitting: Radiation Oncology

## 2019-08-11 DIAGNOSIS — C61 Malignant neoplasm of prostate: Secondary | ICD-10-CM | POA: Diagnosis not present

## 2019-08-14 ENCOUNTER — Ambulatory Visit
Admission: RE | Admit: 2019-08-14 | Discharge: 2019-08-14 | Disposition: A | Discharge status: Home / Self Care | Payer: Medicare PPO | Source: Ambulatory Visit | Attending: Radiation Oncology | Admitting: Radiation Oncology

## 2019-08-14 ENCOUNTER — Other Ambulatory Visit: Payer: Self-pay

## 2019-08-14 DIAGNOSIS — C61 Malignant neoplasm of prostate: Secondary | ICD-10-CM | POA: Diagnosis not present

## 2019-08-15 ENCOUNTER — Ambulatory Visit
Admission: RE | Admit: 2019-08-15 | Discharge: 2019-08-15 | Disposition: A | Discharge status: Home / Self Care | Payer: Medicare PPO | Source: Ambulatory Visit | Attending: Radiation Oncology | Admitting: Radiation Oncology

## 2019-08-15 ENCOUNTER — Other Ambulatory Visit: Payer: Self-pay

## 2019-08-15 DIAGNOSIS — C61 Malignant neoplasm of prostate: Secondary | ICD-10-CM | POA: Diagnosis not present

## 2019-08-16 ENCOUNTER — Ambulatory Visit
Admission: RE | Admit: 2019-08-16 | Discharge: 2019-08-16 | Disposition: A | Discharge status: Home / Self Care | Payer: Medicare PPO | Source: Ambulatory Visit | Attending: Radiation Oncology | Admitting: Radiation Oncology

## 2019-08-16 ENCOUNTER — Other Ambulatory Visit: Payer: Self-pay

## 2019-08-16 DIAGNOSIS — C61 Malignant neoplasm of prostate: Secondary | ICD-10-CM | POA: Diagnosis not present

## 2019-08-17 ENCOUNTER — Ambulatory Visit
Admission: RE | Admit: 2019-08-17 | Discharge: 2019-08-17 | Disposition: A | Discharge status: Home / Self Care | Payer: Medicare PPO | Source: Ambulatory Visit | Attending: Radiation Oncology | Admitting: Radiation Oncology

## 2019-08-17 ENCOUNTER — Other Ambulatory Visit: Payer: Self-pay

## 2019-08-17 ENCOUNTER — Other Ambulatory Visit: Payer: Self-pay | Admitting: Radiation Oncology

## 2019-08-17 DIAGNOSIS — C61 Malignant neoplasm of prostate: Secondary | ICD-10-CM | POA: Diagnosis not present

## 2019-08-17 MED ORDER — TAMSULOSIN HCL 0.4 MG PO CAPS: 0.4000 mg | ORAL_CAPSULE | Freq: Every day | ORAL | 5 refills | Status: DC

## 2019-08-18 ENCOUNTER — Other Ambulatory Visit: Payer: Self-pay

## 2019-08-18 ENCOUNTER — Ambulatory Visit
Admission: RE | Admit: 2019-08-18 | Discharge: 2019-08-18 | Disposition: A | Discharge status: Home / Self Care | Payer: Medicare PPO | Source: Ambulatory Visit | Attending: Radiation Oncology | Admitting: Radiation Oncology

## 2019-08-18 DIAGNOSIS — C61 Malignant neoplasm of prostate: Secondary | ICD-10-CM | POA: Diagnosis not present

## 2019-08-22 ENCOUNTER — Other Ambulatory Visit: Payer: Self-pay

## 2019-08-22 ENCOUNTER — Ambulatory Visit
Admission: RE | Admit: 2019-08-22 | Discharge: 2019-08-22 | Disposition: A | Discharge status: Home / Self Care | Payer: Medicare PPO | Source: Ambulatory Visit | Attending: Radiation Oncology | Admitting: Radiation Oncology

## 2019-08-22 DIAGNOSIS — C61 Malignant neoplasm of prostate: Secondary | ICD-10-CM | POA: Insufficient documentation

## 2019-08-23 ENCOUNTER — Other Ambulatory Visit: Payer: Self-pay

## 2019-08-23 ENCOUNTER — Ambulatory Visit
Admission: RE | Admit: 2019-08-23 | Discharge: 2019-08-23 | Disposition: A | Discharge status: Home / Self Care | Payer: Medicare PPO | Source: Ambulatory Visit | Attending: Radiation Oncology | Admitting: Radiation Oncology

## 2019-08-23 DIAGNOSIS — C61 Malignant neoplasm of prostate: Secondary | ICD-10-CM | POA: Diagnosis not present

## 2019-08-24 ENCOUNTER — Other Ambulatory Visit: Payer: Self-pay

## 2019-08-24 ENCOUNTER — Ambulatory Visit
Admission: RE | Admit: 2019-08-24 | Discharge: 2019-08-24 | Disposition: A | Discharge status: Home / Self Care | Payer: Medicare PPO | Source: Ambulatory Visit | Attending: Radiation Oncology | Admitting: Radiation Oncology

## 2019-08-24 DIAGNOSIS — C61 Malignant neoplasm of prostate: Secondary | ICD-10-CM | POA: Diagnosis not present

## 2019-08-25 ENCOUNTER — Other Ambulatory Visit: Payer: Self-pay

## 2019-08-25 ENCOUNTER — Ambulatory Visit
Admission: RE | Admit: 2019-08-25 | Discharge: 2019-08-25 | Disposition: A | Discharge status: Home / Self Care | Payer: Medicare PPO | Source: Ambulatory Visit | Attending: Radiation Oncology | Admitting: Radiation Oncology

## 2019-08-25 DIAGNOSIS — C61 Malignant neoplasm of prostate: Secondary | ICD-10-CM | POA: Diagnosis not present

## 2019-08-28 ENCOUNTER — Other Ambulatory Visit: Payer: Self-pay

## 2019-08-28 ENCOUNTER — Ambulatory Visit
Admission: RE | Admit: 2019-08-28 | Discharge: 2019-08-28 | Disposition: A | Discharge status: Home / Self Care | Payer: Medicare PPO | Source: Ambulatory Visit | Attending: Radiation Oncology | Admitting: Radiation Oncology

## 2019-08-28 DIAGNOSIS — C61 Malignant neoplasm of prostate: Secondary | ICD-10-CM | POA: Diagnosis not present

## 2019-08-29 ENCOUNTER — Other Ambulatory Visit: Payer: Self-pay

## 2019-08-29 ENCOUNTER — Ambulatory Visit
Admission: RE | Admit: 2019-08-29 | Discharge: 2019-08-29 | Disposition: A | Discharge status: Home / Self Care | Payer: Medicare PPO | Source: Ambulatory Visit | Attending: Radiation Oncology | Admitting: Radiation Oncology

## 2019-08-29 DIAGNOSIS — C61 Malignant neoplasm of prostate: Secondary | ICD-10-CM | POA: Diagnosis not present

## 2019-08-30 ENCOUNTER — Other Ambulatory Visit: Payer: Self-pay

## 2019-08-30 ENCOUNTER — Ambulatory Visit
Admission: RE | Admit: 2019-08-30 | Discharge: 2019-08-30 | Disposition: A | Discharge status: Home / Self Care | Payer: Medicare PPO | Source: Ambulatory Visit | Attending: Radiation Oncology | Admitting: Radiation Oncology

## 2019-08-30 DIAGNOSIS — C61 Malignant neoplasm of prostate: Secondary | ICD-10-CM | POA: Diagnosis not present

## 2019-08-31 ENCOUNTER — Ambulatory Visit
Admission: RE | Admit: 2019-08-31 | Discharge: 2019-08-31 | Disposition: A | Discharge status: Home / Self Care | Payer: Medicare PPO | Source: Ambulatory Visit | Attending: Radiation Oncology | Admitting: Radiation Oncology

## 2019-08-31 ENCOUNTER — Other Ambulatory Visit: Payer: Self-pay

## 2019-08-31 DIAGNOSIS — C61 Malignant neoplasm of prostate: Secondary | ICD-10-CM | POA: Diagnosis not present

## 2019-09-01 ENCOUNTER — Ambulatory Visit
Admission: RE | Admit: 2019-09-01 | Discharge: 2019-09-01 | Disposition: A | Discharge status: Home / Self Care | Payer: Medicare PPO | Source: Ambulatory Visit | Attending: Radiation Oncology | Admitting: Radiation Oncology

## 2019-09-01 ENCOUNTER — Other Ambulatory Visit: Payer: Self-pay

## 2019-09-01 DIAGNOSIS — C61 Malignant neoplasm of prostate: Secondary | ICD-10-CM | POA: Diagnosis not present

## 2019-09-04 ENCOUNTER — Ambulatory Visit
Admission: RE | Admit: 2019-09-04 | Discharge: 2019-09-04 | Disposition: A | Discharge status: Home / Self Care | Payer: Medicare PPO | Source: Ambulatory Visit | Attending: Radiation Oncology | Admitting: Radiation Oncology

## 2019-09-04 ENCOUNTER — Other Ambulatory Visit: Payer: Self-pay | Admitting: Internal Medicine

## 2019-09-04 ENCOUNTER — Other Ambulatory Visit: Payer: Self-pay

## 2019-09-04 DIAGNOSIS — C61 Malignant neoplasm of prostate: Secondary | ICD-10-CM | POA: Diagnosis not present

## 2019-09-05 ENCOUNTER — Ambulatory Visit
Admission: RE | Admit: 2019-09-05 | Discharge: 2019-09-05 | Disposition: A | Discharge status: Home / Self Care | Payer: Medicare PPO | Source: Ambulatory Visit | Attending: Radiation Oncology | Admitting: Radiation Oncology

## 2019-09-05 ENCOUNTER — Other Ambulatory Visit: Payer: Self-pay

## 2019-09-05 DIAGNOSIS — C61 Malignant neoplasm of prostate: Secondary | ICD-10-CM | POA: Diagnosis not present

## 2019-09-06 ENCOUNTER — Other Ambulatory Visit: Payer: Self-pay

## 2019-09-06 ENCOUNTER — Ambulatory Visit
Admission: RE | Admit: 2019-09-06 | Discharge: 2019-09-06 | Disposition: A | Discharge status: Home / Self Care | Payer: Medicare PPO | Source: Ambulatory Visit | Attending: Radiation Oncology | Admitting: Radiation Oncology

## 2019-09-06 DIAGNOSIS — C61 Malignant neoplasm of prostate: Secondary | ICD-10-CM | POA: Diagnosis not present

## 2019-09-07 ENCOUNTER — Other Ambulatory Visit: Payer: Self-pay

## 2019-09-07 ENCOUNTER — Ambulatory Visit
Admission: RE | Admit: 2019-09-07 | Discharge: 2019-09-07 | Disposition: A | Discharge status: Home / Self Care | Payer: Medicare PPO | Source: Ambulatory Visit | Attending: Radiation Oncology | Admitting: Radiation Oncology

## 2019-09-07 DIAGNOSIS — C61 Malignant neoplasm of prostate: Secondary | ICD-10-CM | POA: Diagnosis not present

## 2019-09-08 ENCOUNTER — Other Ambulatory Visit: Payer: Self-pay

## 2019-09-08 ENCOUNTER — Ambulatory Visit
Admission: RE | Admit: 2019-09-08 | Discharge: 2019-09-08 | Disposition: A | Discharge status: Home / Self Care | Payer: Medicare PPO | Source: Ambulatory Visit | Attending: Radiation Oncology | Admitting: Radiation Oncology

## 2019-09-08 DIAGNOSIS — C61 Malignant neoplasm of prostate: Secondary | ICD-10-CM | POA: Diagnosis not present

## 2019-09-11 ENCOUNTER — Encounter: Payer: Self-pay | Admitting: Radiation Oncology

## 2019-09-11 ENCOUNTER — Other Ambulatory Visit: Payer: Self-pay

## 2019-09-11 ENCOUNTER — Encounter: Payer: Self-pay | Admitting: Medical Oncology

## 2019-09-11 ENCOUNTER — Ambulatory Visit
Admission: RE | Admit: 2019-09-11 | Discharge: 2019-09-11 | Disposition: A | Discharge status: Home / Self Care | Payer: Medicare PPO | Source: Ambulatory Visit | Attending: Radiation Oncology | Admitting: Radiation Oncology

## 2019-09-11 DIAGNOSIS — C61 Malignant neoplasm of prostate: Secondary | ICD-10-CM | POA: Diagnosis not present

## 2019-10-10 ENCOUNTER — Encounter: Payer: Self-pay | Admitting: Urology

## 2019-10-10 ENCOUNTER — Telehealth: Payer: Self-pay

## 2019-10-10 NOTE — Telephone Encounter (Signed)
Spoke with patient in regards to 1 month follow-up telephone appointment with Reather Littler PA on 10/12/19 at 2:00pm. Meaningful use, AUA and prostate questions were reviewed. Patient verbalized understanding of appointment date and time.

## 2019-10-10 NOTE — Progress Notes (Signed)
Patient presents for 1 month follow-up for Prostate Cancer. Patient states nocturia 1 time per night. Patient states mild dysuria. Patient states urine stream is stronger and steadier than before. Patient states that he is emptying his bladder completely. Patient denies urgency, hesitancy or straining. Patient denies leakage. Patient states moderate fatigue. Patient states that he has a follow-up appointment with [facility] on 12/13/19.

## 2019-10-12 ENCOUNTER — Other Ambulatory Visit: Payer: Self-pay

## 2019-10-12 ENCOUNTER — Ambulatory Visit
Admission: RE | Admit: 2019-10-12 | Discharge: 2019-10-12 | Disposition: A | Discharge status: Home / Self Care | Payer: Medicare PPO | Source: Ambulatory Visit | Attending: Urology | Admitting: Urology

## 2019-10-12 DIAGNOSIS — C61 Malignant neoplasm of prostate: Secondary | ICD-10-CM

## 2019-10-12 NOTE — Progress Notes (Signed)
Radiation Oncology         [phone] ________________________________  Name: DEMARIOUS KAPUR MRN: [id number]  Date: 10/12/2019  DOB: [birth year]  Post Treatment Note  CC: Isaac Bliss, Rayford Halsted, MD  Lucas Mallow, MD  Diagnosis:   74 y.o. gentleman with Stage T1c adenocarcinoma of the prostate with Gleason score of 3+4, and PSA of 7.17.  Interval Since Last Radiation:  4 weeks  08/02/19 - 09/11/19:  The prostate was treated to 70 Gy in 28 fractions of 2.5 Gy each.  Narrative:  I spoke with the patient to conduct his routine scheduled 1 month follow up visit via telephone to spare the patient unnecessary potential exposure in the healthcare setting during the current COVID-19 pandemic.  The patient was notified in advance and gave permission to proceed with this visit format. He tolerated the radiation treatments relatively well with only minor urinary symptoms with nocturia x3 per night, stinging at the end of his stream, and increased frequency managed with Flomax daily.  He did experience some occasional constipation which was managed with Metamucil daily.  He also noted moderate fatigue/decreased stamina.                              On review of systems, the patient states that he is doing very well in general.  He continues with mild stinging at the completion of his stream but reports that the force of stream is greatly improved and he feels that he is emptying his bladder adequately on voiding.  The nocturia has significantly improved as well and he is now only getting up once at night.  He specifically denies gross hematuria, hesitancy, straining to void or incontinence.  His current IPSS is 4 with occasional intermittency and nocturia x1.  He reports that his bowel habits are almost back to baseline with only intermittent constipation which is managed with occasional milk of magnesia or Metamucil.  He denies abdominal pain, nausea, vomiting or diarrhea.  He reports a healthy  appetite and is maintaining his weight.  He has noticed a gradual improvement in his fatigue but continues taking a few more naps than usual throughout the day.  Overall, he is quite pleased with his progress to date.  ALLERGIES:  is allergic to zocor [simvastatin].  Meds: Current Outpatient Medications  Medication Sig Dispense Refill  . aspirin 81 MG tablet Take 81 mg by mouth daily.    . clopidogrel (PLAVIX) 75 MG tablet Take 1 tablet by mouth once daily 90 tablet 0  . fluticasone (FLONASE) 50 MCG/ACT nasal spray Place 2 sprays into both nostrils daily. 16 g 5  . Lycopene 10 MG CAPS Take 1 capsule by mouth daily.    . metoprolol succinate (TOPROL-XL) 25 MG 24 hr tablet Take 1 tablet by mouth once daily 90 tablet 1  . Multiple Vitamin tablet Take by mouth.    . NON FORMULARY CPAP machine     . rosuvastatin (CRESTOR) 10 MG tablet TAKE 1/2 (ONE-HALF) TABLET BY MOUTH ONCE DAILY . APPOINTMENT REQUIRED FOR FUTURE REFILLS 45 tablet 0  . tamsulosin (FLOMAX) 0.4 MG CAPS capsule Take 1 capsule (0.4 mg total) by mouth daily after supper. 30 capsule 5  . sildenafil (VIAGRA) 100 MG tablet TAKE ONE TABLET BY MOUTH AS NEEDED FOR  ERECTILE  DYSFUNCTION (Patient not taking: Reported on 06/27/2019) 20 tablet 2   No current facility-administered medications for this encounter.  Physical Findings:  vitals were not taken for this visit.   /Unable to assess due to telephone follow-up visit format.  Lab Findings: Lab Results  Component Value Date   WBC 5.5 09/07/2018   HGB 13.8 09/07/2018   HCT 40.3 09/07/2018   MCV 96.9 09/07/2018   PLT 207.0 09/07/2018     Radiographic Findings: No results found.  Impression/Plan: 1. 74 y.o. gentleman with Stage T1c adenocarcinoma of the prostate with Gleason score of 3+4, and PSA of 7.17. He will continue to follow up with urology for ongoing PSA determinations and has an appointment scheduled with Dr. Gloriann Loan on 12/14/2019.  He continues taking Flomax daily as  prescribed.  He understands what to expect with regards to PSA monitoring going forward. I will look forward to following his response to treatment via correspondence with urology, and would be happy to continue to participate in his care if clinically indicated. I talked to the patient about what to expect in the future, including his risk for erectile dysfunction and rectal bleeding. I encouraged him to call or return to the office if he has any questions regarding his previous radiation or possible radiation side effects. He was comfortable with this plan and will follow up as needed.  Today, a comprehensive survivorship care plan and treatment summary was reviewed with the patient today detailing his prostate cancer diagnosis, treatment course, potential late/long-term effects of treatment, appropriate follow-up care with recommendations for the future, and patient education resources.  A copy of this summary, along with a letter will be sent to the patient's primary care provider via mail/fax/In Basket message after today's visit.   2. Cancer screening:  Due to Mr. Musson history and his age, he should receive screening for skin cancers and colon cancer.  The information and recommendations are listed on the patient's comprehensive care plan/treatment summary and were reviewed in detail with the patient.     3. Health maintenance and wellness promotion: Mr. Witherspoon was encouraged to consume 5-7 servings of fruits and vegetables per day. He was provided a copy of the "Nutrition Rainbow" handout, as well as the handout "Take Control of Your Health and [place]" from the [facility].  He was also encouraged to engage in moderate to vigorous exercise for 30 minutes per day most days of the week. Information was provided regarding the [facility] fitness program, which is designed for cancer survivors to help them become more physically fit after cancer treatments. We  discussed that a healthy BMI is 18.5-24.9 and that maintaining a healthy weight reduces risk of cancer recurrences.  He was instructed to limit his alcohol consumption and continue to abstain from tobacco use.  Lastly, he was encouraged to use sunscreen and wear protective clothing when in the sun.     4. Support services/counseling: It is not uncommon for this period of the patient's cancer care trajectory to be one of many emotions and stressors.  Mr. Plog was encouraged to take advantage of our many support services programs, support groups, and/or counseling in coping with his new life as a cancer survivor after completing anti-cancer treatment.  He was offered support today through active listening and expressive supportive counseling.  He was given information regarding our available services and encouraged to contact me with any questions or for help enrolling in any of our support group/programs.       Nicholos Johns, PA-C

## 2019-10-31 ENCOUNTER — Other Ambulatory Visit: Payer: Self-pay | Admitting: Internal Medicine

## 2019-12-04 ENCOUNTER — Other Ambulatory Visit: Payer: Self-pay | Admitting: Internal Medicine

## 2019-12-08 IMAGING — MR MR PROSTATE WO/W CM
56 series · 56 of 56 positions shown · IV contrast (Multihance 15ml)
Comparison: None.

CLINICAL DATA: Prostate carcinoma, Gleason score 3+3=6.

Creatinine was obtained on site at [HOSPITAL] at [HOSPITAL].
Results: Creatinine 1.1 mg/dL.
EXAM:
MR PROSTATE WITHOUT AND WITH CONTRAST
TECHNIQUE: Multiplanar multisequence MRI images were obtained of the pelvis
centered about the prostate. Pre and post contrast images were
obtained.
CONTRAST:  15mL MULTIHANCE GADOBENATE DIMEGLUMINE 529 MG/ML IV SOLN

[Series 3: bSSFP fat-sat · axial · 8.0mm · 0.74mm/px · 1 of 28 slices shown]
[im 1/28]
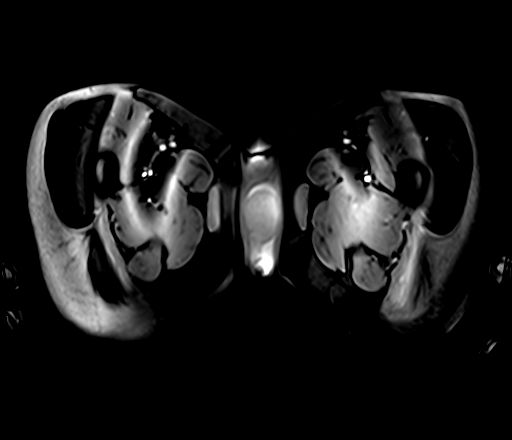

[Series 4: T1 · axial · 8.0mm · 1.06mm/px · 1 of 28 slices shown (1 of 2)]
[im 1/28]
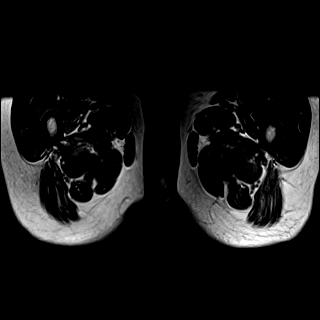

[Series 5: T2 · sagittal · 3.5mm · 0.56mm/px · 1 of 39 slices shown (1 of 4)]
[im 1/39]
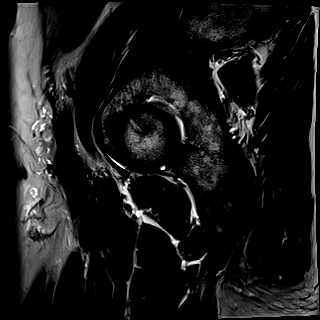

[Series 6: T1 · axial · 3.0mm · 0.31mm/px · 1 of 24 slices shown (2 of 2)]
[im 1/24]
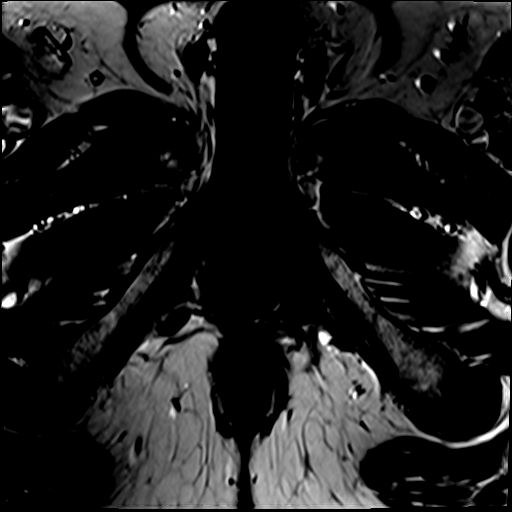

[Series 7: T2 · axial · 3.5mm · 0.56mm/px · 1 of 23 slices shown (2 of 4)]
[im 1/23]
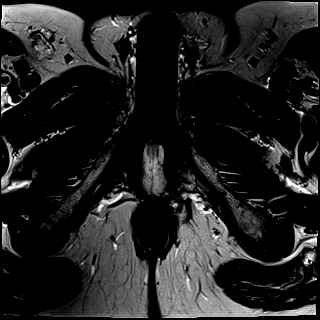

[Series 8: T2 · axial · 1.0mm · 1.04mm/px · 1 of 80 slices shown (3 of 4)]
[im 1/80]
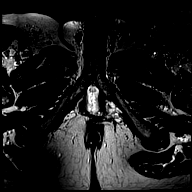

[Series 9: T2 · coronal · 3.5mm · 0.56mm/px · 1 of 23 slices shown (4 of 4)]
[im 1/23]
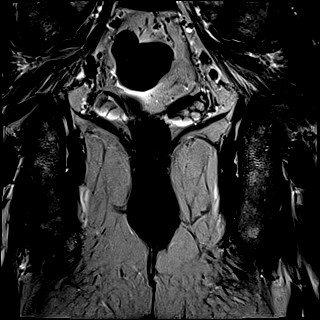

[Series 10: DWI · axial · 3.5mm · 1.56mm/px · 1 of 60 slices shown (1 of 2)]
[im 1/60]
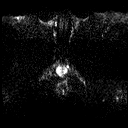

[Series 11: DWI · axial · 3.5mm · 1.56mm/px · 1 of 20 slices shown (2 of 2)]
[im 1/20]
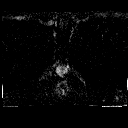

[Series 12: pre t1_twist_tra_dyn_ttc=5.3s · axial · non-contrast · 3.5mm · 0.83mm/px · 1 of 20 slices shown]
[im 1/20]
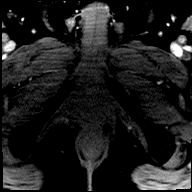

[Series 13: post t1_twist_tra_dyn-copy center · axial · 3.5mm · 0.83mm/px · 1 of 20 slices shown (1 of 24)]
[im 1/20]
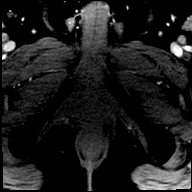

[Series 14: post t1_twist_tra_dyn-copy center · axial · 3.5mm · 0.83mm/px · 1 of 20 slices shown (2 of 24)]
[im 1/20]
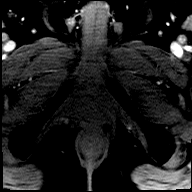

[Series 15: post t1_twist_tra_dyn-copy cent_sub_ttc=(id) · axial · 3.5mm · 0.83mm/px · 1 of 20 slices shown (1 of 22)]
[im 1/20]
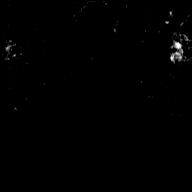

[Series 16: post t1_twist_tra_dyn-copy center · axial · 3.5mm · 0.83mm/px · 1 of 20 slices shown (3 of 24)]
[im 1/20]
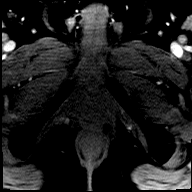

[Series 17: post t1_twist_tra_dyn-copy cent_sub_ttc=(id) · axial · 3.5mm · 0.83mm/px · 1 of 20 slices shown (2 of 22)]
[im 1/20]
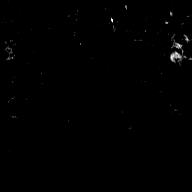

[Series 18: post t1_twist_tra_dyn-copy center · axial · 3.5mm · 0.83mm/px · 1 of 20 slices shown (4 of 24)]
[im 1/20]
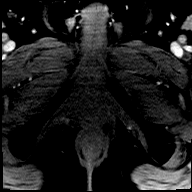

[Series 19: post t1_twist_tra_dyn-copy cent_sub_ttc=(id) · axial · 3.5mm · 0.83mm/px · 1 of 20 slices shown (3 of 22)]
[im 1/20]
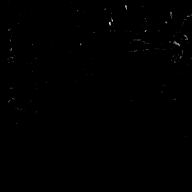

[Series 20: post t1_twist_tra_dyn-copy center · axial · 3.5mm · 0.83mm/px · 1 of 20 slices shown (5 of 24)]
[im 1/20]
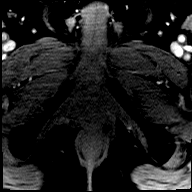

[Series 21: post t1_twist_tra_dyn-copy cent_sub_ttc=(id) · axial · 3.5mm · 0.83mm/px · 1 of 20 slices shown (4 of 22)]
[im 1/20]
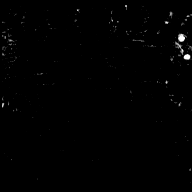

[Series 22: post t1_twist_tra_dyn-copy center · axial · 3.5mm · 0.83mm/px · 1 of 20 slices shown (6 of 24)]
[im 1/20]
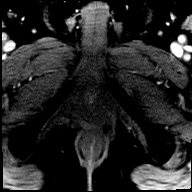

[Series 23: post t1_twist_tra_dyn-copy cent_sub_ttc=(id) · axial · 3.5mm · 0.83mm/px · 1 of 20 slices shown (5 of 22)]
[im 1/20]
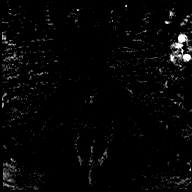

[Series 24: post t1_twist_tra_dyn-copy center · axial · 3.5mm · 0.83mm/px · 1 of 20 slices shown (7 of 24)]
[im 1/20]
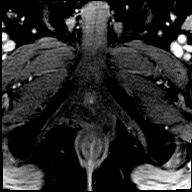

[Series 25: post t1_twist_tra_dyn-copy cent_sub_ttc=(id) · axial · 3.5mm · 0.83mm/px · 1 of 20 slices shown (6 of 22)]
[im 1/20]
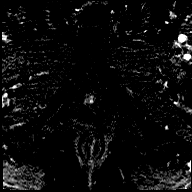

[Series 26: post t1_twist_tra_dyn-copy center · axial · 3.5mm · 0.83mm/px · 1 of 20 slices shown (8 of 24)]
[im 1/20]
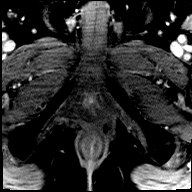

[Series 27: post t1_twist_tra_dyn-copy cent_sub_ttc=(id) · axial · 3.5mm · 0.83mm/px · 1 of 18 slices shown (7 of 22)]
[im 1/18]
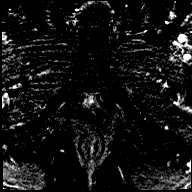

[Series 28: post t1_twist_tra_dyn-copy center · axial · 3.5mm · 0.83mm/px · 1 of 20 slices shown (9 of 24)]
[im 1/20]
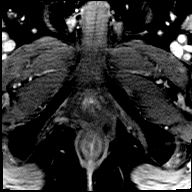

[Series 29: post t1_twist_tra_dyn-copy cent_sub_ttc=(id) · axial · 3.5mm · 0.83mm/px · 1 of 18 slices shown (8 of 22)]
[im 1/18]
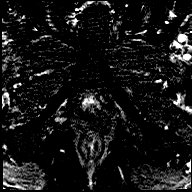

[Series 30: post t1_twist_tra_dyn-copy center · axial · 3.5mm · 0.83mm/px · 1 of 20 slices shown (10 of 24)]
[im 1/20]
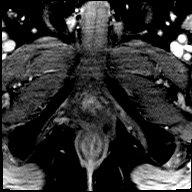

[Series 31: post t1_twist_tra_dyn-copy cent_sub_ttc=(id) · axial · 3.5mm · 0.83mm/px · 1 of 18 slices shown (9 of 22)]
[im 1/18]
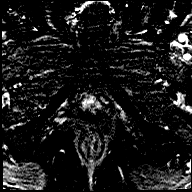

[Series 32: post t1_twist_tra_dyn-copy center · axial · 3.5mm · 0.83mm/px · 1 of 20 slices shown (11 of 24)]
[im 1/20]
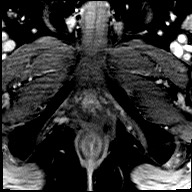

[Series 33: post t1_twist_tra_dyn-copy cent_sub_ttc=(id) · axial · 3.5mm · 0.83mm/px · 1 of 19 slices shown (10 of 22)]
[im 1/19]
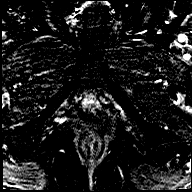

[Series 34: post t1_twist_tra_dyn-copy center · axial · 3.5mm · 0.83mm/px · 1 of 20 slices shown (12 of 24)]
[im 1/20]
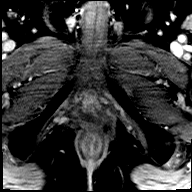

[Series 35: post t1_twist_tra_dyn-copy cent_sub_ttc=(id) · axial · 3.5mm · 0.83mm/px · 1 of 20 slices shown (11 of 22)]
[im 1/20]
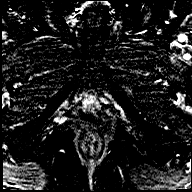

[Series 36: post t1_twist_tra_dyn-copy center · axial · 3.5mm · 0.83mm/px · 1 of 20 slices shown (13 of 24)]
[im 1/20]
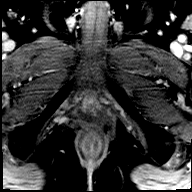

[Series 37: post t1_twist_tra_dyn-copy cent_sub_ttc=(id) · axial · 3.5mm · 0.83mm/px · 1 of 20 slices shown (12 of 22)]
[im 1/20]
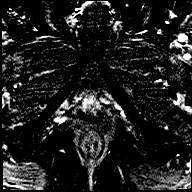

[Series 38: post t1_twist_tra_dyn-copy center · axial · 3.5mm · 0.83mm/px · 1 of 20 slices shown (14 of 24)]
[im 1/20]
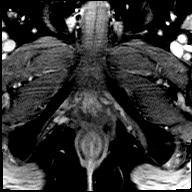

[Series 39: post t1_twist_tra_dyn-copy cent_sub_ttc=(id) · axial · 3.5mm · 0.83mm/px · 1 of 20 slices shown (13 of 22)]
[im 1/20]
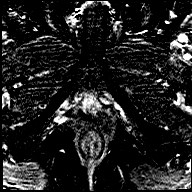

[Series 40: post t1_twist_tra_dyn-copy center · axial · 3.5mm · 0.83mm/px · 1 of 20 slices shown (15 of 24)]
[im 1/20]
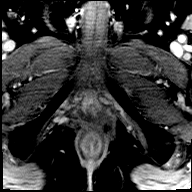

[Series 41: post t1_twist_tra_dyn-copy cent_sub_ttc=(id) · axial · 3.5mm · 0.83mm/px · 1 of 20 slices shown (14 of 22)]
[im 1/20]
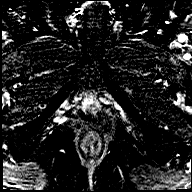

[Series 42: post t1_twist_tra_dyn-copy center · axial · 3.5mm · 0.83mm/px · 1 of 20 slices shown (16 of 24)]
[im 1/20]
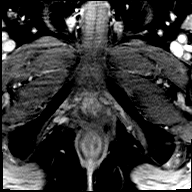

[Series 43: post t1_twist_tra_dyn-copy cent_sub_ttc=(id) · axial · 3.5mm · 0.83mm/px · 1 of 20 slices shown (15 of 22)]
[im 1/20]
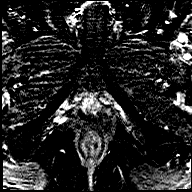

[Series 44: post t1_twist_tra_dyn-copy center · axial · 3.5mm · 0.83mm/px · 1 of 20 slices shown (17 of 24)]
[im 1/20]
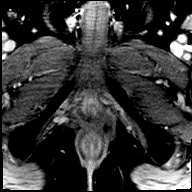

[Series 45: post t1_twist_tra_dyn-copy cent_sub_ttc=(id) · axial · 3.5mm · 0.83mm/px · 1 of 20 slices shown (16 of 22)]
[im 1/20]
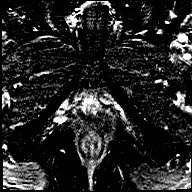

[Series 46: post t1_twist_tra_dyn-copy center · axial · 3.5mm · 0.83mm/px · 1 of 20 slices shown (18 of 24)]
[im 1/20]
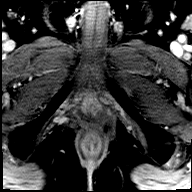

[Series 47: post t1_twist_tra_dyn-copy cent_sub_ttc=(id) · axial · 3.5mm · 0.83mm/px · 1 of 20 slices shown (17 of 22)]
[im 1/20]
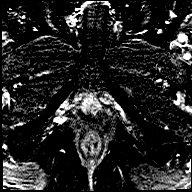

[Series 48: post t1_twist_tra_dyn-copy center · axial · 3.5mm · 0.83mm/px · 1 of 20 slices shown (19 of 24)]
[im 1/20]
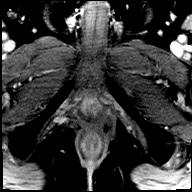

[Series 49: post t1_twist_tra_dyn-copy cent_sub_ttc=(id) · axial · 3.5mm · 0.83mm/px · 1 of 20 slices shown (18 of 22)]
[im 1/20]
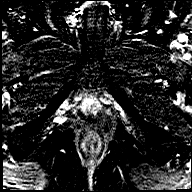

[Series 50: post t1_twist_tra_dyn-copy center · axial · 3.5mm · 0.83mm/px · 1 of 20 slices shown (20 of 24)]
[im 1/20]
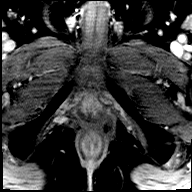

[Series 51: post t1_twist_tra_dyn-copy cent_sub_ttc=(id) · axial · 3.5mm · 0.83mm/px · 1 of 20 slices shown (19 of 22)]
[im 1/20]
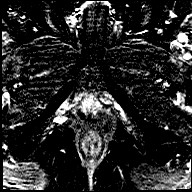

[Series 52: post t1_twist_tra_dyn-copy center · axial · 3.5mm · 0.83mm/px · 1 of 20 slices shown (21 of 24)]
[im 1/20]
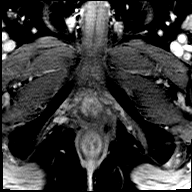

[Series 53: post t1_twist_tra_dyn-copy cent_sub_ttc=(id) · axial · 3.5mm · 0.83mm/px · 1 of 20 slices shown (20 of 22)]
[im 1/20]
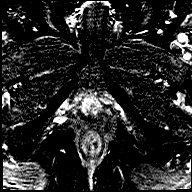

[Series 54: post t1_twist_tra_dyn-copy center · axial · 3.5mm · 0.83mm/px · 1 of 20 slices shown (22 of 24)]
[im 1/20]
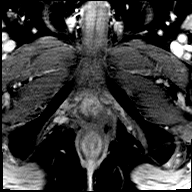

[Series 55: post t1_twist_tra_dyn-copy cent_sub_ttc=(id) · axial · 3.5mm · 0.83mm/px · 1 of 20 slices shown (21 of 22)]
[im 1/20]
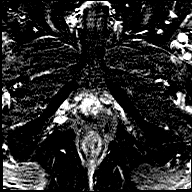

[Series 56: post t1_twist_tra_dyn-copy center · axial · 3.5mm · 0.83mm/px · 1 of 20 slices shown (23 of 24)]
[im 1/20]
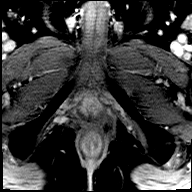

[Series 57: post t1_twist_tra_dyn-copy cent_sub_ttc=(id) · axial · 3.5mm · 0.83mm/px · 1 of 20 slices shown (22 of 22)]
[im 1/20]
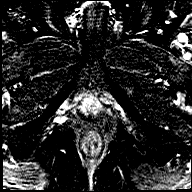

[Series 58: post t1_twist_tra_dyn-copy center · axial · 3.5mm · 0.83mm/px · 1 of 20 slices shown (24 of 24)]
[im 1/20]
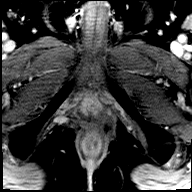

[56 of 56 positions shown; findings below may reference images not displayed]

FINDINGS: Prostate:

-- Peripheral Zone: Linear/wedge shaped hypointensity is seen on
ADC, right side greater than left, however no focal nodules with ADC
hypointensity or high b-value DWI hyperintensity identified.

-- Transition/Central Zone: Circumscribed BPH nodules noted, but no
suspicious nodules with obscured or non-circumscribed margins seen.

-- Measurements/Volume:  4.6 x 4.1 by 5.5 cm (volume = 54 cm^3)

Transcapsular spread:  Absent

Seminal vesicle involvement:  Absent

Neurovascular bundle involvement:  Absent

Pelvic adenopathy: None visualized

Bone metastasis: None visualized

Other:  None
IMPRESSION: No radiographic evidence of high-grade prostate carcinoma. PI-RADS
2: Low (clinically significant cancer is unlikely to be present)

## 2019-12-14 DIAGNOSIS — C61 Malignant neoplasm of prostate: Secondary | ICD-10-CM | POA: Diagnosis not present

## 2019-12-27 ENCOUNTER — Other Ambulatory Visit: Payer: Self-pay | Admitting: Internal Medicine

## 2019-12-27 NOTE — Telephone Encounter (Signed)
metoprolol succinate (TOPROL-XL) 25 MG 24 hr tablet  rosuvastatin (CRESTOR) 10 MG tablet  clopidogrel (PLAVIX) 75 MG tablet   [facility]), [place] [facility] [place] Phone:  [phone]  Fax:  [phone]     Patient has his physical appointment scheduled for December 7

## 2020-01-01 ENCOUNTER — Other Ambulatory Visit: Payer: Self-pay | Admitting: Internal Medicine

## 2020-01-02 MED ORDER — CLOPIDOGREL BISULFATE 75 MG PO TABS: 75.0000 mg | ORAL_TABLET | Freq: Every day | ORAL | 1 refills | Status: DC

## 2020-01-02 MED ORDER — METOPROLOL SUCCINATE ER 25 MG PO TB24: ORAL_TABLET | ORAL | 1 refills | Status: DC

## 2020-01-02 MED ORDER — ROSUVASTATIN CALCIUM 10 MG PO TABS: ORAL_TABLET | ORAL | 1 refills | Status: DC

## 2020-01-02 NOTE — Telephone Encounter (Signed)
Rx's sent in to cover pt until CPE.

## 2020-01-02 NOTE — Telephone Encounter (Signed)
Pt is calling in stating that he is out of all of the Rx's below and would like to see if it can be called in today.  Pharm: [facility] on [address]

## 2020-01-02 NOTE — Addendum Note (Signed)
Addended by: Nathanial Millman E on: 01/02/2020 12:07 PM   Modules accepted: Orders

## 2020-02-19 ENCOUNTER — Encounter: Payer: Self-pay | Admitting: Medical Oncology

## 2020-02-19 NOTE — Progress Notes (Signed)
Patient called asking if he can get a refill on Flomax? He states it has really helped him with emptying his bladder. I explained he has been discharged back to Dr. Gloriann Loan but I will be happy to call [facility] and request this for him. I spoke with Lovey Newcomer at [facility] and she will forward request to Dr. Duaine Dredge in Dr. Purvis Sheffield absence.

## 2020-02-27 ENCOUNTER — Encounter: Payer: Self-pay | Admitting: Internal Medicine

## 2020-02-27 ENCOUNTER — Ambulatory Visit (INDEPENDENT_AMBULATORY_CARE_PROVIDER_SITE_OTHER): Payer: Medicare PPO | Admitting: Internal Medicine

## 2020-02-27 ENCOUNTER — Other Ambulatory Visit: Payer: Self-pay

## 2020-02-27 VITALS — BP 130/80 | HR 58 | Temp 98.2°F | Wt 175.8 lb

## 2020-02-27 DIAGNOSIS — E785 Hyperlipidemia, unspecified: Secondary | ICD-10-CM

## 2020-02-27 DIAGNOSIS — Z23 Encounter for immunization: Secondary | ICD-10-CM

## 2020-02-27 DIAGNOSIS — Z0000 Encounter for general adult medical examination without abnormal findings: Secondary | ICD-10-CM

## 2020-02-27 DIAGNOSIS — Z1211 Encounter for screening for malignant neoplasm of colon: Secondary | ICD-10-CM | POA: Diagnosis not present

## 2020-02-27 DIAGNOSIS — G4733 Obstructive sleep apnea (adult) (pediatric): Secondary | ICD-10-CM | POA: Diagnosis not present

## 2020-02-27 DIAGNOSIS — I10 Essential (primary) hypertension: Secondary | ICD-10-CM

## 2020-02-27 DIAGNOSIS — I2510 Atherosclerotic heart disease of native coronary artery without angina pectoris: Secondary | ICD-10-CM | POA: Diagnosis not present

## 2020-02-27 DIAGNOSIS — K219 Gastro-esophageal reflux disease without esophagitis: Secondary | ICD-10-CM

## 2020-02-27 DIAGNOSIS — C61 Malignant neoplasm of prostate: Secondary | ICD-10-CM

## 2020-02-27 NOTE — Addendum Note (Signed)
Addended by: Westley Hummer B on: 02/27/2020 01:32 PM   Modules accepted: Orders

## 2020-02-27 NOTE — Addendum Note (Signed)
Addended by: Westley Hummer B on: 02/27/2020 03:45 PM   Modules accepted: Orders

## 2020-02-27 NOTE — Patient Instructions (Signed)
-Nice seeing you today!!  -Lab work today; will notify you once results are available.  -Flu vaccine today.  -Remember your shingles vaccine at your pharmacy.,  -Schedule follow up in 1 year or sooner as needed.   Preventive Care 74 Years and Older, Male Preventive care refers to lifestyle choices and visits with your health care provider that can promote health and wellness. This includes:  A yearly physical exam. This is also called an annual well check.  Regular dental and eye exams.  Immunizations.  Screening for certain conditions.  Healthy lifestyle choices, such as diet and exercise. What can I expect for my preventive care visit? Physical exam Your health care provider will check:  Height and weight. These may be used to calculate body mass index (BMI), which is a measurement that tells if you are at a healthy weight.  Heart rate and blood pressure.  Your skin for abnormal spots. Counseling Your health care provider may ask you questions about:  Alcohol, tobacco, and drug use.  Emotional well-being.  Home and relationship well-being.  Sexual activity.  Eating habits.  History of falls.  Memory and ability to understand (cognition).  Work and work Statistician. What immunizations do I need?  Influenza (flu) vaccine  This is recommended every year. Tetanus, diphtheria, and pertussis (Tdap) vaccine  You may need a Td booster every 10 years. Varicella (chickenpox) vaccine  You may need this vaccine if you have not already been vaccinated. Zoster (shingles) vaccine  You may need this after age 74. Pneumococcal conjugate (PCV13) vaccine  One dose is recommended after age 55. Pneumococcal polysaccharide (PPSV23) vaccine  One dose is recommended after age 42. Measles, mumps, and rubella (MMR) vaccine  You may need at least one dose of MMR if you were born in 1957 or later. You may also need a second dose. Meningococcal conjugate (MenACWY)  vaccine  You may need this if you have certain conditions. Hepatitis A vaccine  You may need this if you have certain conditions or if you travel or work in places where you may be exposed to hepatitis A. Hepatitis B vaccine  You may need this if you have certain conditions or if you travel or work in places where you may be exposed to hepatitis B. Haemophilus influenzae type b (Hib) vaccine  You may need this if you have certain conditions. You may receive vaccines as individual doses or as more than one vaccine together in one shot (combination vaccines). Talk with your health care provider about the risks and benefits of combination vaccines. What tests do I need? Blood tests  Lipid and cholesterol levels. These may be checked every 5 years, or more frequently depending on your overall health.  Hepatitis C test.  Hepatitis B test. Screening  Lung cancer screening. You may have this screening every year starting at age 29 if you have a 30-pack-year history of smoking and currently smoke or have quit within the past 15 years.  Colorectal cancer screening. All adults should have this screening starting at age 74 and continuing until age 70. Your health care provider may recommend screening at age 74 if you are at increased risk. You will have tests every 1-10 years, depending on your results and the type of screening test.  Prostate cancer screening. Recommendations will vary depending on your family history and other risks.  Diabetes screening. This is done by checking your blood sugar (glucose) after you have not eaten for a while (fasting). You may  have this done every 1-3 years.  Abdominal aortic aneurysm (AAA) screening. You may need this if you are a current or former smoker.  Sexually transmitted disease (STD) testing. Follow these instructions at home: Eating and drinking  Eat a diet that includes fresh fruits and vegetables, whole grains, lean protein, and low-fat dairy  products. Limit your intake of foods with high amounts of sugar, saturated fats, and salt.  Take vitamin and mineral supplements as recommended by your health care provider.  Do not drink alcohol if your health care provider tells you not to drink.  If you drink alcohol: ? Limit how much you have to 0-2 drinks a day. ? Be aware of how much alcohol is in your drink. In the [place] one drink equals one 12 oz bottle of beer (355 mL), one 5 oz glass of wine (148 mL), or one 1 oz glass of hard liquor (44 mL). Lifestyle  Take daily care of your teeth and gums.  Stay active. Exercise for at least 30 minutes on 5 or more days each week.  Do not use any products that contain nicotine or tobacco, such as cigarettes, e-cigarettes, and chewing tobacco. If you need help quitting, ask your health care provider.  If you are sexually active, practice safe sex. Use a condom or other form of protection to prevent STIs (sexually transmitted infections).  Talk with your health care provider about taking a low-dose aspirin or statin. What's next?  Visit your health care provider once a year for a well check visit.  Ask your health care provider how often you should have your eyes and teeth checked.  Stay up to date on all vaccines. This information is not intended to replace advice given to you by your health care provider. Make sure you discuss any questions you have with your health care provider. Document Revised: 03/03/2018 Document Reviewed: 03/03/2018 Elsevier Patient Education  2020 [employer].

## 2020-02-27 NOTE — Progress Notes (Signed)
Established Patient Office Visit     This visit occurred during the SARS-CoV-2 public health emergency.  Safety protocols were in place, including screening questions prior to the visit, additional usage of staff PPE, and extensive cleaning of exam room while observing appropriate contact time as indicated for disinfecting solutions.    CC/Reason for Visit: Annual preventive exam and subsequent Medicare wellness visit  HPI: Ian Roman is a 74 y.o. male who is coming in today for the above mentioned reasons. Past Medical History is significant for: Coronary artery disease status post remote stenting with a low risk stress test in 2019 who follows with cardiology on an annual basis, hyperlipidemia, GERD, hypertension.  He was most recently diagnosed with prostate cancer.  He has been under the care of Dr. Gloriann Loan and Dr. Tammi Klippel.  He just completed radiation therapy.  He has no acute complaints today.  He has not had routine eye and dental care.  He has completed his Covid vaccination series.  He had a colonoscopy in 2018 and is a 3-year recall.  He needs to complete his shingles vaccination series.  He is due for flu vaccine today.   Past Medical/Surgical History: Past Medical History:  Diagnosis Date  . Coronary artery disease    Status post stenting x2 2007  . Hyperlipidemia   . Hypertension   . Personal history of colonic polyps   . Prostate cancer ([place])   . Prostatitis   . Stroke ([place])    tia   . Vertigo     Past Surgical History:  Procedure Laterality Date  . COLONOSCOPY    . CORONARY ANGIOPLASTY WITH STENT PLACEMENT     x2   2007  . PROSTATE BIOPSY      Social History:  reports that he has never smoked. He has never used smokeless tobacco. He reports current alcohol use. He reports that he does not use drugs.  Allergies: Allergies  Allergen Reactions  . Zocor [Simvastatin]     Intolerance    Family History:  Family History  Problem Relation Age of Onset   . Heart disease Mother   . Prostate cancer Father   . Colon cancer Neg Hx   . Breast cancer Neg Hx   . Pancreatic cancer Neg Hx      Current Outpatient Medications:  .  aspirin 81 MG tablet, Take 81 mg by mouth daily., Disp: , Rfl:  .  clopidogrel (PLAVIX) 75 MG tablet, Take 1 tablet (75 mg total) by mouth daily., Disp: 30 tablet, Rfl: 1 .  fluticasone (FLONASE) 50 MCG/ACT nasal spray, Place 2 sprays into both nostrils daily., Disp: 16 g, Rfl: 5 .  Lycopene 10 MG CAPS, Take 1 capsule by mouth daily., Disp: , Rfl:  .  metoprolol succinate (TOPROL-XL) 25 MG 24 hr tablet, TAKE 1 TABLET BY MOUTH ONCE DAILY(LAST REFILL NEEDS APPT), Disp: 30 tablet, Rfl: 1 .  Multiple Vitamin tablet, Take by mouth., Disp: , Rfl:  .  NON FORMULARY, CPAP machine , Disp: , Rfl:  .  rosuvastatin (CRESTOR) 10 MG tablet, TAKE 1/2 (ONE-HALF) TABLET BY MOUTH ONCE DAILY . APPOINTMENT REQUIRED FOR FUTURE REFILLS, Disp: 30 tablet, Rfl: 1 .  sildenafil (VIAGRA) 100 MG tablet, TAKE ONE TABLET BY MOUTH AS NEEDED FOR  ERECTILE  DYSFUNCTION, Disp: 20 tablet, Rfl: 2 .  tamsulosin (FLOMAX) 0.4 MG CAPS capsule, Take 1 capsule (0.4 mg total) by mouth daily after supper., Disp: 30 capsule, Rfl: 5  Review  of Systems:  Constitutional: Denies fever, chills, diaphoresis, appetite change and fatigue.  HEENT: Denies photophobia, eye pain, redness, hearing loss, ear pain, congestion, sore throat, rhinorrhea, sneezing, mouth sores, trouble swallowing, neck pain, neck stiffness and tinnitus.   Respiratory: Denies SOB, DOE, cough, chest tightness,  and wheezing.   Cardiovascular: Denies chest pain, palpitations and leg swelling.  Gastrointestinal: Denies nausea, vomiting, abdominal pain, diarrhea, constipation, blood in stool and abdominal distention.  Genitourinary: Denies dysuria, urgency, frequency, hematuria, flank pain and difficulty urinating.  Endocrine: Denies: hot or cold intolerance, sweats, changes in hair or nails, polyuria,  polydipsia. Musculoskeletal: Denies myalgias, back pain, joint swelling, arthralgias and gait problem.  Skin: Denies pallor, rash and wound.  Neurological: Denies dizziness, seizures, syncope, weakness, light-headedness, numbness and headaches.  Hematological: Denies adenopathy. Easy bruising, personal or family bleeding history  Psychiatric/Behavioral: Denies suicidal ideation, mood changes, confusion, nervousness, sleep disturbance and agitation    Physical Exam: Vitals:   02/27/20 1304  BP: 130/80  Pulse: (!) 58  Temp: 98.2 F (36.8 C)  TempSrc: Oral  SpO2: 98%  Weight: 175 lb 12.8 oz (79.7 kg)    Body mass index is 27.53 kg/m.   Constitutional: NAD, calm, comfortable Eyes: PERRL, lids and conjunctivae normal, wears corrective lenses ENMT: Mucous membranes are moist. Posterior pharynx clear of any exudate or lesions. Normal dentition. Tympanic membrane is pearly white, no erythema or bulging. Neck: normal, supple, no masses, no thyromegaly Respiratory: clear to auscultation bilaterally, no wheezing, no crackles. Normal respiratory effort. No accessory muscle use.  Cardiovascular: Regular rate and rhythm, no murmurs / rubs / gallops. No extremity edema. 2+ pedal pulses.  Abdomen: no tenderness, no masses palpated. No hepatosplenomegaly. Bowel sounds positive.  Musculoskeletal: no clubbing / cyanosis. No joint deformity upper and lower extremities. Good ROM, no contractures. Normal muscle tone.  Skin: no rashes, lesions, ulcers. No induration Neurologic: CN 2-12 grossly intact. Sensation intact, DTR normal. Strength 5/5 in all 4.  Psychiatric: Normal judgment and insight. Alert and oriented x 3. Normal mood.    Subsequent Medicare wellness visit   1. Risk factors, based on past  M,S,F -cardiovascular disease risk factors include age, gender, history of coronary artery disease, hyperlipidemia, hypertension.   2.  Physical activities: Walks on a daily basis   3.   Depression/mood:  Stable, not depressed   4.  Hearing:  No perceived issues   5.  ADL's: Independent in all ADLs   6.  Fall risk:  Low fall risk   7.  Home safety: No problems identified   8.  Height weight, and visual acuity: height and weight as above, vision:   Visual Acuity Screening   Right eye Left eye Both eyes  Without correction:     With correction: 20/16 20/20 20/16      9.  Counseling:  Advised follow-up with cardiology and urology as scheduled   10. Lab orders based on risk factors: Laboratory update will be reviewed   11. Referral :  None today   12. Care plan:  Follow-up with me in 1 year or as needed   13. Cognitive assessment:  No cognitive impairment   14. Screening: Patient provided with a written and personalized 5-10 year screening schedule in the AVS.   yes   15. Provider List Update:   PCP, urologist Dr. Gloriann Loan, radiation oncologist Dr. Tammi Klippel, cardiologist Dr. Scarlette Calico  16. Advance Directives: Full code     Office Visit from 02/27/2020 in [facility] at [facility]  PHQ-9 Total Score 1      Fall Risk  02/27/2020 09/07/2018 03/02/2018 08/23/2017 08/23/2017  Falls in the past year? 0 0 0 No No  Number falls in past yr: 0 0 0 - -  Injury with Fall? 0 0 0 - -     Impression and Plan:  Encounter for preventive health examination  -Advised routine eye and dental care. -Flu vaccine administered today. -Have advised shingles vaccination at pharmacy. -He had a colonoscopy in 2018 and is a 3-year callback, I will refer him back to GI.  Malignant neoplasm of prostate ([place]) -Under the care of urology and radiation oncology, he tells me he just completed radiation treatment.  Gastroesophageal reflux disease without esophagitis -Well-controlled, not on daily PPI therapy.  Dyslipidemia  - Plan: Lipid panel -Last LDL was 88 in June 2020.  Primary hypertension -Well-controlled, mainly managed by cardiology.  Coronary artery disease involving  native coronary artery of native heart without angina pectoris -Stable, no chest pain, followed by cardiology.  Obstructive sleep apnea -On nightly CPAP.  Need for influenza vaccination -Flu vaccine administered today.    Patient Instructions  -Nice seeing you today!!  -Lab work today; will notify you once results are available.  -Flu vaccine today.  -Remember your shingles vaccine at your pharmacy.,  -Schedule follow up in 1 year or sooner as needed.   Preventive Care 62 Years and Older, Male Preventive care refers to lifestyle choices and visits with your health care provider that can promote health and wellness. This includes:  A yearly physical exam. This is also called an annual well check.  Regular dental and eye exams.  Immunizations.  Screening for certain conditions.  Healthy lifestyle choices, such as diet and exercise. What can I expect for my preventive care visit? Physical exam Your health care provider will check:  Height and weight. These may be used to calculate body mass index (BMI), which is a measurement that tells if you are at a healthy weight.  Heart rate and blood pressure.  Your skin for abnormal spots. Counseling Your health care provider may ask you questions about:  Alcohol, tobacco, and drug use.  Emotional well-being.  Home and relationship well-being.  Sexual activity.  Eating habits.  History of falls.  Memory and ability to understand (cognition).  Work and work Statistician. What immunizations do I need?  Influenza (flu) vaccine  This is recommended every year. Tetanus, diphtheria, and pertussis (Tdap) vaccine  You may need a Td booster every 10 years. Varicella (chickenpox) vaccine  You may need this vaccine if you have not already been vaccinated. Zoster (shingles) vaccine  You may need this after age 44. Pneumococcal conjugate (PCV13) vaccine  One dose is recommended after age 3. Pneumococcal  polysaccharide (PPSV23) vaccine  One dose is recommended after age 60. Measles, mumps, and rubella (MMR) vaccine  You may need at least one dose of MMR if you were born in 1957 or later. You may also need a second dose. Meningococcal conjugate (MenACWY) vaccine  You may need this if you have certain conditions. Hepatitis A vaccine  You may need this if you have certain conditions or if you travel or work in places where you may be exposed to hepatitis A. Hepatitis B vaccine  You may need this if you have certain conditions or if you travel or work in places where you may be exposed to hepatitis B. Haemophilus influenzae type b (Hib) vaccine  You may need this if you  have certain conditions. You may receive vaccines as individual doses or as more than one vaccine together in one shot (combination vaccines). Talk with your health care provider about the risks and benefits of combination vaccines. What tests do I need? Blood tests  Lipid and cholesterol levels. These may be checked every 5 years, or more frequently depending on your overall health.  Hepatitis C test.  Hepatitis B test. Screening  Lung cancer screening. You may have this screening every year starting at age 82 if you have a 30-pack-year history of smoking and currently smoke or have quit within the past 15 years.  Colorectal cancer screening. All adults should have this screening starting at age 16 and continuing until age 52. Your health care provider may recommend screening at age 65 if you are at increased risk. You will have tests every 1-10 years, depending on your results and the type of screening test.  Prostate cancer screening. Recommendations will vary depending on your family history and other risks.  Diabetes screening. This is done by checking your blood sugar (glucose) after you have not eaten for a while (fasting). You may have this done every 1-3 years.  Abdominal aortic aneurysm (AAA) screening. You  may need this if you are a current or former smoker.  Sexually transmitted disease (STD) testing. Follow these instructions at home: Eating and drinking  Eat a diet that includes fresh fruits and vegetables, whole grains, lean protein, and low-fat dairy products. Limit your intake of foods with high amounts of sugar, saturated fats, and salt.  Take vitamin and mineral supplements as recommended by your health care provider.  Do not drink alcohol if your health care provider tells you not to drink.  If you drink alcohol: ? Limit how much you have to 0-2 drinks a day. ? Be aware of how much alcohol is in your drink. In the [place] one drink equals one 12 oz bottle of beer (355 mL), one 5 oz glass of wine (148 mL), or one 1 oz glass of hard liquor (44 mL). Lifestyle  Take daily care of your teeth and gums.  Stay active. Exercise for at least 30 minutes on 5 or more days each week.  Do not use any products that contain nicotine or tobacco, such as cigarettes, e-cigarettes, and chewing tobacco. If you need help quitting, ask your health care provider.  If you are sexually active, practice safe sex. Use a condom or other form of protection to prevent STIs (sexually transmitted infections).  Talk with your health care provider about taking a low-dose aspirin or statin. What's next?  Visit your health care provider once a year for a well check visit.  Ask your health care provider how often you should have your eyes and teeth checked.  Stay up to date on all vaccines. This information is not intended to replace advice given to you by your health care provider. Make sure you discuss any questions you have with your health care provider. Document Revised: 03/03/2018 Document Reviewed: 03/03/2018 Elsevier Patient Education  2020 [place], MD [facility]

## 2020-02-27 NOTE — Addendum Note (Signed)
Addended by: Marrion Coy on: 02/27/2020 01:35 PM   Modules accepted: Orders

## 2020-02-28 ENCOUNTER — Telehealth: Payer: Self-pay | Admitting: Internal Medicine

## 2020-02-28 LAB — COMPREHENSIVE METABOLIC PANEL
AG Ratio: 1.7 (calc) (ref 1.0–2.5)
ALT: 18 U/L (ref 9–46)
AST: 22 U/L (ref 10–35)
Albumin: 4.5 g/dL (ref 3.6–5.1)
Alkaline phosphatase (APISO): 59 U/L (ref 35–144)
BUN: 13 mg/dL (ref 7–25)
CO2: 29 mmol/L (ref 20–32)
Calcium: 9.5 mg/dL (ref 8.6–10.3)
Chloride: 102 mmol/L (ref 98–110)
Creat: 1.17 mg/dL (ref 0.70–1.18)
Globulin: 2.6 g/dL (calc) (ref 1.9–3.7)
Glucose, Bld: 98 mg/dL (ref 65–99)
Potassium: 4.7 mmol/L (ref 3.5–5.3)
Sodium: 139 mmol/L (ref 135–146)
Total Bilirubin: 1.2 mg/dL (ref 0.2–1.2)
Total Protein: 7.1 g/dL (ref 6.1–8.1)

## 2020-02-28 LAB — TSH: TSH: 1.81 mIU/L (ref 0.40–4.50)

## 2020-02-28 LAB — CBC WITH DIFFERENTIAL/PLATELET
Absolute Monocytes: 418 cells/uL (ref 200–950)
Basophils Absolute: 28 cells/uL (ref 0–200)
Basophils Relative: 0.5 %
Eosinophils Absolute: 61 cells/uL (ref 15–500)
Eosinophils Relative: 1.1 %
HCT: 39 % (ref 38.5–50.0)
Hemoglobin: 12.9 g/dL — ABNORMAL LOW (ref 13.2–17.1)
Lymphs Abs: 726 cells/uL — ABNORMAL LOW (ref 850–3900)
MCH: 31.8 pg (ref 27.0–33.0)
MCHC: 33.1 g/dL (ref 32.0–36.0)
MCV: 96.1 fL (ref 80.0–100.0)
MPV: 11.2 fL (ref 7.5–12.5)
Monocytes Relative: 7.6 %
Neutro Abs: 4268 cells/uL (ref 1500–7800)
Neutrophils Relative %: 77.6 %
Platelets: 204 10*3/uL (ref 140–400)
RBC: 4.06 10*6/uL — ABNORMAL LOW (ref 4.20–5.80)
RDW: 11.4 % (ref 11.0–15.0)
Total Lymphocyte: 13.2 %
WBC: 5.5 10*3/uL (ref 3.8–10.8)

## 2020-02-28 LAB — HEMOGLOBIN A1C
Hgb A1c MFr Bld: 5 % of total Hgb (ref <5.7)
Mean Plasma Glucose: 97 mg/dL
eAG (mmol/L): 5.4 mmol/L

## 2020-02-28 LAB — VITAMIN D 25 HYDROXY (VIT D DEFICIENCY, FRACTURES): Vit D, 25-Hydroxy: 36 ng/mL (ref 30–100)

## 2020-02-28 LAB — LIPID PANEL
Cholesterol: 136 mg/dL (ref <200)
HDL: 41 mg/dL (ref >=40)
LDL Cholesterol (Calc): 78 mg/dL (calc)
Non-HDL Cholesterol (Calc): 95 mg/dL (calc) (ref <130)
Total CHOL/HDL Ratio: 3.3 (calc) (ref <5.0)
Triglycerides: 86 mg/dL (ref <150)

## 2020-02-28 LAB — VITAMIN B12: Vitamin B-12: 464 pg/mL (ref 200–1100)

## 2020-02-28 NOTE — Telephone Encounter (Signed)
Spoke with patient. See lab result note. 

## 2020-02-28 NOTE — Telephone Encounter (Signed)
Pt is returning the call to the office 

## 2020-03-05 NOTE — Progress Notes (Signed)
  Radiation Oncology         ([phone]) [phone] ________________________________  Name: Ian Roman MRN: [id number]  Date: 09/11/2019  DOB: May 14, 1945  End of Treatment Note  Diagnosis:   74 y.o. gentleman with Stage T1c adenocarcinoma of the prostate with Gleason score of 3+4, and PSA of 7.17.     Indication for treatment:  Curative, Definitive Radiotherapy       Radiation treatment dates:   08/02/19-09/11/19  Site/dose:   The prostate was treated to 70 Gy in 28 fractions of 2.5 Gy  Beams/energy:   The patient was treated with IMRT using volumetric arc therapy delivering 6 MV X-rays to clockwise and counterclockwise circumferential arcs with a 90 degree collimator offset to avoid dose scalloping.  Image guidance was performed with daily cone beam CT prior to each fraction to align to gold markers in the prostate and assure proper bladder and rectal fill volumes.  Immobilization was achieved with BodyFix custom mold.  Narrative: The patient tolerated radiation treatment relatively well.   The patient experienced some minor urinary irritation and modest fatigue.    Plan: The patient has completed radiation treatment. He will return to radiation oncology clinic for routine followup in one month. I advised him to call or return sooner if he has any questions or concerns related to his recovery or treatment. ________________________________  Sheral Apley. Tammi Klippel, M.D.

## 2020-03-18 ENCOUNTER — Other Ambulatory Visit: Payer: Self-pay | Admitting: Internal Medicine

## 2020-04-01 ENCOUNTER — Other Ambulatory Visit: Payer: Self-pay | Admitting: Internal Medicine

## 2020-05-09 ENCOUNTER — Other Ambulatory Visit: Payer: Self-pay | Admitting: Internal Medicine

## 2020-05-14 DIAGNOSIS — R300 Dysuria: Secondary | ICD-10-CM | POA: Diagnosis not present

## 2020-05-14 DIAGNOSIS — C61 Malignant neoplasm of prostate: Secondary | ICD-10-CM | POA: Diagnosis not present

## 2020-05-14 DIAGNOSIS — R3915 Urgency of urination: Secondary | ICD-10-CM | POA: Diagnosis not present

## 2020-05-14 DIAGNOSIS — R351 Nocturia: Secondary | ICD-10-CM | POA: Diagnosis not present

## 2020-05-14 DIAGNOSIS — R350 Frequency of micturition: Secondary | ICD-10-CM | POA: Diagnosis not present

## 2020-05-28 DIAGNOSIS — R351 Nocturia: Secondary | ICD-10-CM | POA: Diagnosis not present

## 2020-05-28 DIAGNOSIS — R3915 Urgency of urination: Secondary | ICD-10-CM | POA: Diagnosis not present

## 2020-05-28 DIAGNOSIS — R300 Dysuria: Secondary | ICD-10-CM | POA: Diagnosis not present

## 2020-06-03 DIAGNOSIS — R300 Dysuria: Secondary | ICD-10-CM | POA: Diagnosis not present

## 2020-06-06 DIAGNOSIS — R351 Nocturia: Secondary | ICD-10-CM | POA: Diagnosis not present

## 2020-06-06 DIAGNOSIS — R350 Frequency of micturition: Secondary | ICD-10-CM | POA: Diagnosis not present

## 2020-06-06 DIAGNOSIS — R300 Dysuria: Secondary | ICD-10-CM | POA: Diagnosis not present

## 2020-06-06 DIAGNOSIS — C61 Malignant neoplasm of prostate: Secondary | ICD-10-CM | POA: Diagnosis not present

## 2020-06-06 DIAGNOSIS — R102 Pelvic and perineal pain: Secondary | ICD-10-CM | POA: Diagnosis not present

## 2020-06-06 DIAGNOSIS — R3912 Poor urinary stream: Secondary | ICD-10-CM | POA: Diagnosis not present

## 2020-06-06 LAB — PSA: PSA: 1.34

## 2020-06-11 ENCOUNTER — Other Ambulatory Visit: Payer: Self-pay | Admitting: Urology

## 2020-06-11 DIAGNOSIS — C61 Malignant neoplasm of prostate: Secondary | ICD-10-CM

## 2020-06-11 DIAGNOSIS — R102 Pelvic and perineal pain: Secondary | ICD-10-CM

## 2020-06-14 DIAGNOSIS — R102 Pelvic and perineal pain: Secondary | ICD-10-CM | POA: Diagnosis not present

## 2020-06-14 DIAGNOSIS — R300 Dysuria: Secondary | ICD-10-CM | POA: Diagnosis not present

## 2020-06-14 DIAGNOSIS — C61 Malignant neoplasm of prostate: Secondary | ICD-10-CM | POA: Diagnosis not present

## 2020-06-15 ENCOUNTER — Ambulatory Visit (HOSPITAL_COMMUNITY)
Admission: RE | Admit: 2020-06-15 | Discharge: 2020-06-15 | Disposition: A | Discharge status: Home / Self Care | Payer: Medicare PPO | Source: Ambulatory Visit | Attending: Urology | Admitting: Urology

## 2020-06-15 ENCOUNTER — Other Ambulatory Visit: Payer: Self-pay

## 2020-06-15 ENCOUNTER — Ambulatory Visit (HOSPITAL_COMMUNITY): Admission: RE | Admit: 2020-06-15 | Payer: Medicare PPO | Source: Ambulatory Visit

## 2020-06-15 ENCOUNTER — Encounter (HOSPITAL_COMMUNITY): Payer: Self-pay

## 2020-06-15 DIAGNOSIS — C61 Malignant neoplasm of prostate: Secondary | ICD-10-CM

## 2020-06-15 DIAGNOSIS — R102 Pelvic and perineal pain: Secondary | ICD-10-CM

## 2020-06-19 ENCOUNTER — Encounter: Payer: Self-pay | Admitting: Internal Medicine

## 2020-06-19 ENCOUNTER — Ambulatory Visit: Payer: Medicare PPO | Admitting: Internal Medicine

## 2020-06-19 ENCOUNTER — Other Ambulatory Visit: Payer: Self-pay

## 2020-06-19 VITALS — BP 128/80 | HR 84 | Temp 98.5°F | Ht 67.0 in | Wt 160.5 lb

## 2020-06-19 DIAGNOSIS — R300 Dysuria: Secondary | ICD-10-CM

## 2020-06-19 LAB — POC URINALSYSI DIPSTICK (AUTOMATED)
Bilirubin, UA: NEGATIVE
Glucose, UA: NEGATIVE
Ketones, UA: NEGATIVE
Leukocytes, UA: NEGATIVE
Nitrite, UA: NEGATIVE
Protein, UA: POSITIVE — AB
Spec Grav, UA: >=1.03 — AB (ref 1.010–1.025)
Urobilinogen, UA: 0.2 E.U./dL
pH, UA: 6 (ref 5.0–8.0)

## 2020-06-19 MED ORDER — SULFAMETHOXAZOLE-TRIMETHOPRIM 800-160 MG PO TABS: 1.0000 | ORAL_TABLET | Freq: Two times a day (BID) | ORAL | 0 refills | Status: AC

## 2020-06-19 NOTE — Progress Notes (Addendum)
Established Patient Office Visit     This visit occurred during the SARS-CoV-2 public health emergency.  Safety protocols were in place, including screening questions prior to the visit, additional usage of staff PPE, and extensive cleaning of exam room while observing appropriate contact time as indicated for disinfecting solutions.    CC/Reason for Visit: Burning with urination  HPI: Ian Roman is a 75 y.o. male who is coming in today for the above mentioned reasons.  He has a history of prostate cancer and completed radiation therapy in June of last year.  He tells me that since middle of February he has been dealing with this issue.  He has seen his urologist, Dr. Gloriann Loan, several times.  He has been prescribed 2 separate courses of antibiotics, he has been given different pain medications including tramadol, hydrocodone and oxycodone.  He tells me that about 2 weeks ago he had a procedure where they "put a needle in his penis" (I am assuming this is a cystoscopy however I have not yet received a report on this).  There is also an MRI that is currently pending.  He continues to deal with this issue.  His wife has made him an appointment with the [facility] urology department but this is not until June.  Patient is in significant pain and wonders if I can help.  Past Medical/Surgical History: Past Medical History:  Diagnosis Date  . Coronary artery disease    Status post stenting x2 2007  . Hyperlipidemia   . Hypertension   . Personal history of colonic polyps   . Prostate cancer ([place])   . Prostatitis   . Stroke ([place])    tia   . Vertigo     Past Surgical History:  Procedure Laterality Date  . COLONOSCOPY    . CORONARY ANGIOPLASTY WITH STENT PLACEMENT     x2   2007  . PROSTATE BIOPSY      Social History:  reports that he has never smoked. He has never used smokeless tobacco. He reports current alcohol use. He reports that he does not use drugs.  Allergies: Allergies   Allergen Reactions  . Zocor [Simvastatin]     Intolerance    Family History:  Family History  Problem Relation Age of Onset  . Heart disease Mother   . Prostate cancer Father   . Colon cancer Neg Hx   . Breast cancer Neg Hx   . Pancreatic cancer Neg Hx      Current Outpatient Medications:  .  aspirin 81 MG tablet, Take 81 mg by mouth daily., Disp: , Rfl:  .  clopidogrel (PLAVIX) 75 MG tablet, Take 1 tablet by mouth once daily, Disp: 90 tablet, Rfl: 1 .  fluticasone (FLONASE) 50 MCG/ACT nasal spray, Place 2 sprays into both nostrils daily., Disp: 16 g, Rfl: 5 .  Lycopene 10 MG CAPS, Take 1 capsule by mouth daily., Disp: , Rfl:  .  metoprolol succinate (TOPROL-XL) 25 MG 24 hr tablet, TAKE 1 TABLET BY MOUTH ONCE DAILY ., Disp: 90 tablet, Rfl: 1 .  Multiple Vitamin tablet, Take by mouth., Disp: , Rfl:  .  rosuvastatin (CRESTOR) 10 MG tablet, TAKE 1/2 (ONE-HALF) TABLET BY MOUTH ONCE DAILY ., Disp: 90 tablet, Rfl: 1 .  sulfamethoxazole-trimethoprim (BACTRIM DS) 800-160 MG tablet, Take 1 tablet by mouth 2 (two) times daily for 7 days., Disp: 14 tablet, Rfl: 0 .  tamsulosin (FLOMAX) 0.4 MG CAPS capsule, Take 1 capsule (0.4 mg  total) by mouth daily after supper., Disp: 30 capsule, Rfl: 5  Review of Systems:  Constitutional: Denies fever, chills, diaphoresis, appetite change and fatigue.  HEENT: Denies photophobia, eye pain, redness, hearing loss, ear pain, congestion, sore throat, rhinorrhea, sneezing, mouth sores, trouble swallowing, neck pain, neck stiffness and tinnitus.   Respiratory: Denies SOB, DOE, cough, chest tightness,  and wheezing.   Cardiovascular: Denies chest pain, palpitations and leg swelling.  Gastrointestinal: Denies nausea, vomiting, abdominal pain, diarrhea, constipation, blood in stool and abdominal distention.  Genitourinary: Denies  urgency, frequency, hematuria, flank pain and difficulty urinating.  Endocrine: Denies: hot or cold intolerance, sweats, changes in  hair or nails, polyuria, polydipsia. Musculoskeletal: Denies myalgias, back pain, joint swelling, arthralgias and gait problem.  Skin: Denies pallor, rash and wound.  Neurological: Denies dizziness, seizures, syncope, weakness, light-headedness, numbness and headaches.  Hematological: Denies adenopathy. Easy bruising, personal or family bleeding history  Psychiatric/Behavioral: Denies suicidal ideation, mood changes, confusion, nervousness, sleep disturbance and agitation    Physical Exam: Vitals:   06/19/20 0937  BP: 128/80  Pulse: 84  Temp: 98.5 F (36.9 C)  TempSrc: Oral  SpO2: 97%  Weight: 160 lb 8 oz (72.8 kg)  Height: 5\' 7"  (1.702 m)    Body mass index is 25.14 kg/m.   Constitutional: NAD, calm, comfortable Eyes: PERRL, lids and conjunctivae normal, wears corrective lenses ENMT: Mucous membranes are moist.  Neurologic: Grossly intact and nonfocal.  Psychiatric: Normal judgment and insight. Alert and oriented x 3. Normal mood.    Impression and Plan:  Dysuria  -Etiology remains unclear to me.  I wonder about radiation cystitis, also possibly repeat UTI given blood present in dipstick in office today. -I will give him another course of Bactrim and send for urine culture. -I have advised that second opinion with urology is of importance, unfortunately have nothing further to add.    Lelon Frohlich, MD [facility]

## 2020-06-20 DIAGNOSIS — R300 Dysuria: Secondary | ICD-10-CM | POA: Diagnosis not present

## 2020-06-20 LAB — URINE CULTURE
MICRO NUMBER:: 11711107
Result:: NO GROWTH
SPECIMEN QUALITY:: ADEQUATE

## 2020-06-20 NOTE — Addendum Note (Signed)
Addended by: Janann Colonel on: 06/20/2020 04:47 PM   Modules accepted: Orders

## 2020-06-20 NOTE — Addendum Note (Signed)
Addended by: Tessie Fass D on: 06/20/2020 04:46 PM   Modules accepted: Orders

## 2020-06-20 NOTE — Addendum Note (Signed)
Addended by: Tessie Fass D on: 06/20/2020 04:45 PM   Modules accepted: Orders

## 2020-06-21 ENCOUNTER — Other Ambulatory Visit: Payer: Self-pay

## 2020-06-21 ENCOUNTER — Encounter (HOSPITAL_COMMUNITY): Payer: Self-pay

## 2020-06-21 ENCOUNTER — Emergency Department (HOSPITAL_COMMUNITY)
Admission: EM | Admit: 2020-06-21 | Discharge: 2020-06-21 | Disposition: A | Discharge status: Home / Self Care | Payer: Medicare PPO | Attending: Emergency Medicine | Admitting: Emergency Medicine

## 2020-06-21 DIAGNOSIS — R319 Hematuria, unspecified: Secondary | ICD-10-CM | POA: Insufficient documentation

## 2020-06-21 DIAGNOSIS — Z8546 Personal history of malignant neoplasm of prostate: Secondary | ICD-10-CM | POA: Diagnosis not present

## 2020-06-21 DIAGNOSIS — I10 Essential (primary) hypertension: Secondary | ICD-10-CM | POA: Insufficient documentation

## 2020-06-21 DIAGNOSIS — Z7982 Long term (current) use of aspirin: Secondary | ICD-10-CM | POA: Insufficient documentation

## 2020-06-21 DIAGNOSIS — R1084 Generalized abdominal pain: Secondary | ICD-10-CM | POA: Diagnosis not present

## 2020-06-21 DIAGNOSIS — I2510 Atherosclerotic heart disease of native coronary artery without angina pectoris: Secondary | ICD-10-CM | POA: Insufficient documentation

## 2020-06-21 DIAGNOSIS — Z79899 Other long term (current) drug therapy: Secondary | ICD-10-CM | POA: Insufficient documentation

## 2020-06-21 DIAGNOSIS — G8929 Other chronic pain: Secondary | ICD-10-CM | POA: Diagnosis not present

## 2020-06-21 DIAGNOSIS — R300 Dysuria: Secondary | ICD-10-CM | POA: Diagnosis not present

## 2020-06-21 DIAGNOSIS — Z7902 Long term (current) use of antithrombotics/antiplatelets: Secondary | ICD-10-CM | POA: Insufficient documentation

## 2020-06-21 LAB — URINALYSIS
Bilirubin Urine: NEGATIVE
Glucose, UA: NEGATIVE
Ketones, ur: NEGATIVE
Leukocytes,Ua: NEGATIVE
Nitrite: NEGATIVE
Specific Gravity, Urine: 1.03 (ref 1.001–1.03)
pH: 5 (ref 5.0–8.0)

## 2020-06-21 LAB — URINALYSIS, ROUTINE W REFLEX MICROSCOPIC
Bilirubin Urine: NEGATIVE
Glucose, UA: NEGATIVE mg/dL
Ketones, ur: 5 mg/dL — AB
Leukocytes,Ua: NEGATIVE
Nitrite: NEGATIVE
Protein, ur: 100 mg/dL — AB
RBC / HPF: >50 RBC/hpf — ABNORMAL HIGH (ref 0–5)
Specific Gravity, Urine: 1.029 (ref 1.005–1.030)
pH: 5 (ref 5.0–8.0)

## 2020-06-21 MED ORDER — HYDROMORPHONE HCL 1 MG/ML IJ SOLN
1.0000 mg | Freq: Once | INTRAMUSCULAR | Status: AC
  Administered 2020-06-21: 1 mg via INTRAMUSCULAR
  Filled 2020-06-21: qty 1

## 2020-06-21 MED ORDER — HYDROCODONE-ACETAMINOPHEN 7.5-325 MG PO TABS
1.0000 | ORAL_TABLET | ORAL | 0 refills | Status: DC | PRN
Start: 2020-06-21 — End: 2021-12-16

## 2020-06-21 NOTE — Discharge Instructions (Addendum)
Continue current antibiotic.  See the Urologist on Monday as scheduled.

## 2020-06-21 NOTE — ED Triage Notes (Signed)
Pt presents with c/o painful urination for approx one month. Pt has been keeping up with his doctor at [facility] and has been taking medication for this that does not seem to be working.

## 2020-06-21 NOTE — ED Provider Notes (Signed)
[facility] DEPT Provider Note   CSN: [id number] Arrival date & time: 06/21/20  0745     History Chief Complaint  Patient presents with  . Painful urination     Ian Roman is a 75 y.o. male.  The history is provided by the patient. No language interpreter was used.  Dysuria Presenting symptoms: dysuria   Context: during urination   Relieved by:  Nothing Worsened by:  Nothing Ineffective treatments:  None tried Associated symptoms: groin pain and hematuria   Pt has a history of cancer and radiation      Past Medical History:  Diagnosis Date  . Coronary artery disease    Status post stenting x2 2007  . Hyperlipidemia   . Hypertension   . Personal history of colonic polyps   . Prostate cancer ([place])   . Prostatitis   . Stroke ([place])    tia   . Vertigo     Patient Active Problem List   Diagnosis Date Noted  . Malignant neoplasm of prostate ([place]) 06/27/2019  . Erectile dysfunction 08/02/2017  . Personal history of colonic polyps-adenomas 09/29/2013  . Elevated PSA 04/13/2013  . Hyperlipidemia, mixed 04/06/2012  . Benign prostatic hyperplasia with urinary obstruction 12/02/2011  . Carotid artery disease ([place]) 04/01/2011  . GERD (gastroesophageal reflux disease) 04/01/2011  . Prostatitis 04/01/2011  . TIA (transient ischemic attack) 04/01/2011  . Coronary artery disease 01/08/2011  . Hypertension 01/08/2011  . Dyslipidemia 01/08/2011  . Obstructive sleep apnea 01/08/2011  . Positional vertigo 01/08/2011    Past Surgical History:  Procedure Laterality Date  . COLONOSCOPY    . CORONARY ANGIOPLASTY WITH STENT PLACEMENT     x2   2007  . PROSTATE BIOPSY         Family History  Problem Relation Age of Onset  . Heart disease Mother   . Prostate cancer Father   . Colon cancer Neg Hx   . Breast cancer Neg Hx   . Pancreatic cancer Neg Hx     Social History   Tobacco Use  . Smoking status: Never Smoker  . Smokeless  tobacco: Never Used  Vaping Use  . Vaping Use: Never used  Substance Use Topics  . Alcohol use: Yes    Comment: once a month per pt  . Drug use: No    Home Medications Prior to Admission medications   Medication Sig Start Date End Date Taking? Authorizing Provider  HYDROcodone-acetaminophen (NORCO) 7.5-325 MG tablet Take 1 tablet by mouth every 4 (four) hours as needed for moderate pain or severe pain. 06/21/20  Yes Fransico Meadow, PA-C  aspirin 81 MG tablet Take 81 mg by mouth daily.    [provider]  clopidogrel (PLAVIX) 75 MG tablet Take 1 tablet by mouth once daily 04/02/20   Isaac Bliss, Rayford Halsted, MD  fluticasone [facility]) 50 MCG/ACT nasal spray Place 2 sprays into both nostrils daily. 05/22/14   Marletta Lor, MD  Lycopene 10 MG CAPS Take 1 capsule by mouth daily.    [provider]  metoprolol succinate (TOPROL-XL) 25 MG 24 hr tablet TAKE 1 TABLET BY MOUTH ONCE DAILY . 03/19/20   Isaac Bliss, Rayford Halsted, MD  Multiple Vitamin tablet Take by mouth. 01/03/08   [provider]  rosuvastatin (CRESTOR) 10 MG tablet TAKE 1/2 (ONE-HALF) TABLET BY MOUTH ONCE DAILY . 05/09/20   Isaac Bliss, Rayford Halsted, MD  sulfamethoxazole-trimethoprim (BACTRIM DS) 800-160 MG tablet Take 1 tablet by mouth  2 (two) times daily for 7 days. 06/19/20 06/26/20  Isaac Bliss, Rayford Halsted, MD  tamsulosin (FLOMAX) 0.4 MG CAPS capsule Take 1 capsule (0.4 mg total) by mouth daily after supper. 08/17/19   Tyler Pita, MD    Allergies    Zocor [simvastatin]  Review of Systems   Review of Systems  Genitourinary: Positive for dysuria and hematuria.  All other systems reviewed and are negative.   Physical Exam Updated Vital Signs BP (!) 142/73   Pulse 60   Temp 98.7 F (37.1 C) (Oral)   Resp 13   SpO2 98%   Physical Exam Vitals and nursing note reviewed.  Constitutional:      Appearance: He is well-developed.  HENT:     Head: Normocephalic and atraumatic.   Eyes:     Conjunctiva/sclera: Conjunctivae normal.  Cardiovascular:     Rate and Rhythm: Normal rate and regular rhythm.     Heart sounds: No murmur heard.   Pulmonary:     Effort: Pulmonary effort is normal. No respiratory distress.     Breath sounds: Normal breath sounds.  Abdominal:     Palpations: Abdomen is soft.     Tenderness: There is no abdominal tenderness.  Musculoskeletal:     Cervical back: Neck supple.  Skin:    General: Skin is warm and dry.  Neurological:     Mental Status: He is alert.  Psychiatric:        Mood and Affect: Mood normal.     ED Results / Procedures / Treatments   Labs (all labs ordered are listed, but only abnormal results are displayed) Labs Reviewed  URINALYSIS, ROUTINE W REFLEX MICROSCOPIC - Abnormal; Notable for the following components:      Result Value   APPearance HAZY (*)    Hgb urine dipstick MODERATE (*)    Ketones, ur 5 (*)    Protein, ur 100 (*)    RBC / HPF >50 (*)    Bacteria, UA RARE (*)    All other components within normal limits    EKG None  Radiology No results found.  Procedures Procedures   Medications Ordered in ED Medications  HYDROmorphone (DILAUDID) injection 1 mg (1 mg Intramuscular Given 06/21/20 0846)    ED Course  I have reviewed the triage vital signs and the nursing notes.  Pertinent labs & imaging results that were available during my care of the patient were reviewed by me and considered in my medical decision making (see chart for details).    MDM Rules/Calculators/A&P                          UA shows blood,  Pt given dilaudid IM.  I spoke to Dr. Gloriann Loan who feels pt has chronic pain post radiation.  Pt is scheduled for recheck on Monday.  I wil give pt medication for pain.  He is advised to keep appointment  Final Clinical Impression(s) / ED Diagnoses Final diagnoses:  Dysuria  Generalized abdominal pain    Rx / DC Orders ED Discharge Orders         Ordered     HYDROcodone-acetaminophen (NORCO) 7.5-325 MG tablet  Every 4 hours PRN        06/21/20 0957        An After Visit Summary was printed and given to the patient.    Sidney Ace 06/21/20 1112    Davonna Belling, MD 06/21/20 1431

## 2020-06-22 DIAGNOSIS — R3121 Asymptomatic microscopic hematuria: Secondary | ICD-10-CM | POA: Diagnosis not present

## 2020-06-24 DIAGNOSIS — R350 Frequency of micturition: Secondary | ICD-10-CM | POA: Diagnosis not present

## 2020-06-24 DIAGNOSIS — R3121 Asymptomatic microscopic hematuria: Secondary | ICD-10-CM | POA: Diagnosis not present

## 2020-06-24 DIAGNOSIS — C61 Malignant neoplasm of prostate: Secondary | ICD-10-CM | POA: Diagnosis not present

## 2020-06-24 DIAGNOSIS — R300 Dysuria: Secondary | ICD-10-CM | POA: Diagnosis not present

## 2020-06-24 DIAGNOSIS — R351 Nocturia: Secondary | ICD-10-CM | POA: Diagnosis not present

## 2020-06-24 DIAGNOSIS — R102 Pelvic and perineal pain: Secondary | ICD-10-CM | POA: Diagnosis not present

## 2020-06-25 ENCOUNTER — Encounter: Payer: Self-pay | Admitting: Internal Medicine

## 2020-07-01 DIAGNOSIS — M62838 Other muscle spasm: Secondary | ICD-10-CM | POA: Diagnosis not present

## 2020-07-01 DIAGNOSIS — R300 Dysuria: Secondary | ICD-10-CM | POA: Diagnosis not present

## 2020-07-01 DIAGNOSIS — R102 Pelvic and perineal pain: Secondary | ICD-10-CM | POA: Diagnosis not present

## 2020-07-09 DIAGNOSIS — R3121 Asymptomatic microscopic hematuria: Secondary | ICD-10-CM | POA: Diagnosis not present

## 2020-07-09 DIAGNOSIS — I700 Atherosclerosis of aorta: Secondary | ICD-10-CM | POA: Diagnosis not present

## 2020-07-09 DIAGNOSIS — N3289 Other specified disorders of bladder: Secondary | ICD-10-CM | POA: Diagnosis not present

## 2020-07-09 DIAGNOSIS — N401 Enlarged prostate with lower urinary tract symptoms: Secondary | ICD-10-CM | POA: Diagnosis not present

## 2020-07-09 DIAGNOSIS — R3129 Other microscopic hematuria: Secondary | ICD-10-CM | POA: Diagnosis not present

## 2020-07-10 ENCOUNTER — Encounter (HOSPITAL_BASED_OUTPATIENT_CLINIC_OR_DEPARTMENT_OTHER): Payer: Medicare PPO | Admitting: Physician Assistant

## 2020-07-10 DIAGNOSIS — R102 Pelvic and perineal pain: Secondary | ICD-10-CM | POA: Diagnosis not present

## 2020-07-10 DIAGNOSIS — R350 Frequency of micturition: Secondary | ICD-10-CM | POA: Diagnosis not present

## 2020-07-10 DIAGNOSIS — R3915 Urgency of urination: Secondary | ICD-10-CM | POA: Diagnosis not present

## 2020-07-10 DIAGNOSIS — M62838 Other muscle spasm: Secondary | ICD-10-CM | POA: Diagnosis not present

## 2020-07-19 DIAGNOSIS — R3912 Poor urinary stream: Secondary | ICD-10-CM | POA: Diagnosis not present

## 2020-07-19 DIAGNOSIS — R3915 Urgency of urination: Secondary | ICD-10-CM | POA: Diagnosis not present

## 2020-07-19 DIAGNOSIS — R102 Pelvic and perineal pain: Secondary | ICD-10-CM | POA: Diagnosis not present

## 2020-07-19 DIAGNOSIS — R350 Frequency of micturition: Secondary | ICD-10-CM | POA: Diagnosis not present

## 2020-07-19 DIAGNOSIS — M62838 Other muscle spasm: Secondary | ICD-10-CM | POA: Diagnosis not present

## 2020-08-09 DIAGNOSIS — R3915 Urgency of urination: Secondary | ICD-10-CM | POA: Diagnosis not present

## 2020-08-09 DIAGNOSIS — R350 Frequency of micturition: Secondary | ICD-10-CM | POA: Diagnosis not present

## 2020-08-09 DIAGNOSIS — R102 Pelvic and perineal pain: Secondary | ICD-10-CM | POA: Diagnosis not present

## 2020-08-09 DIAGNOSIS — R300 Dysuria: Secondary | ICD-10-CM | POA: Diagnosis not present

## 2020-08-09 DIAGNOSIS — N401 Enlarged prostate with lower urinary tract symptoms: Secondary | ICD-10-CM | POA: Diagnosis not present

## 2020-08-09 DIAGNOSIS — M62838 Other muscle spasm: Secondary | ICD-10-CM | POA: Diagnosis not present

## 2020-08-09 DIAGNOSIS — C61 Malignant neoplasm of prostate: Secondary | ICD-10-CM | POA: Diagnosis not present

## 2020-08-13 ENCOUNTER — Encounter: Payer: Self-pay | Admitting: Internal Medicine

## 2020-09-01 ENCOUNTER — Other Ambulatory Visit: Payer: Self-pay | Admitting: Internal Medicine

## 2020-09-07 ENCOUNTER — Other Ambulatory Visit: Payer: Self-pay | Admitting: Internal Medicine

## 2020-09-09 ENCOUNTER — Telehealth: Payer: Self-pay | Admitting: Internal Medicine

## 2020-09-09 NOTE — Telephone Encounter (Signed)
Pt is calling in stating that he needs refills on Rx metoprolol succinate (TOPROL -XL) 25 MG and clopidogrel (PLAVIX) 75 MG  Pharm:  [facility] on [address]

## 2020-09-10 MED ORDER — CLOPIDOGREL BISULFATE 75 MG PO TABS: 1.0000 | ORAL_TABLET | Freq: Every day | ORAL | 1 refills | Status: DC

## 2020-09-10 MED ORDER — METOPROLOL SUCCINATE ER 25 MG PO TB24: 1.0000 | ORAL_TABLET | Freq: Every day | ORAL | 1 refills | Status: DC

## 2020-09-10 NOTE — Telephone Encounter (Signed)
Refills sent

## 2020-09-27 DIAGNOSIS — R102 Pelvic and perineal pain: Secondary | ICD-10-CM | POA: Diagnosis not present

## 2020-09-27 DIAGNOSIS — R300 Dysuria: Secondary | ICD-10-CM | POA: Diagnosis not present

## 2020-09-27 DIAGNOSIS — C61 Malignant neoplasm of prostate: Secondary | ICD-10-CM | POA: Diagnosis not present

## 2020-10-02 ENCOUNTER — Encounter: Payer: Self-pay | Admitting: Internal Medicine

## 2020-11-21 DIAGNOSIS — C61 Malignant neoplasm of prostate: Secondary | ICD-10-CM | POA: Diagnosis not present

## 2020-11-21 LAB — PSA: PSA: 0.34

## 2020-11-28 DIAGNOSIS — R102 Pelvic and perineal pain: Secondary | ICD-10-CM | POA: Diagnosis not present

## 2020-11-28 DIAGNOSIS — R300 Dysuria: Secondary | ICD-10-CM | POA: Diagnosis not present

## 2020-11-28 DIAGNOSIS — C61 Malignant neoplasm of prostate: Secondary | ICD-10-CM | POA: Diagnosis not present

## 2020-12-12 ENCOUNTER — Ambulatory Visit: Payer: Medicare PPO | Admitting: Internal Medicine

## 2020-12-13 ENCOUNTER — Encounter: Payer: Self-pay | Admitting: Internal Medicine

## 2021-01-07 ENCOUNTER — Ambulatory Visit (INDEPENDENT_AMBULATORY_CARE_PROVIDER_SITE_OTHER): Payer: Medicare PPO

## 2021-01-07 ENCOUNTER — Telehealth: Payer: Self-pay | Admitting: Internal Medicine

## 2021-01-07 ENCOUNTER — Other Ambulatory Visit: Payer: Self-pay

## 2021-01-07 DIAGNOSIS — Z23 Encounter for immunization: Secondary | ICD-10-CM

## 2021-01-07 NOTE — Telephone Encounter (Signed)
Patient wants to get a shingles shot. Is he able to do that?  Please advise.

## 2021-01-08 NOTE — Telephone Encounter (Signed)
Spoke with patient's wife and patient is aware to go to the pharmacy for a shingles vaccine

## 2021-05-07 ENCOUNTER — Other Ambulatory Visit: Payer: Self-pay | Admitting: Internal Medicine

## 2021-05-23 DIAGNOSIS — C61 Malignant neoplasm of prostate: Secondary | ICD-10-CM | POA: Diagnosis not present

## 2021-05-23 LAB — PSA: PSA: 0.25

## 2021-05-28 ENCOUNTER — Other Ambulatory Visit: Payer: Self-pay | Admitting: Internal Medicine

## 2021-05-30 ENCOUNTER — Other Ambulatory Visit: Payer: Self-pay | Admitting: Internal Medicine

## 2021-05-30 DIAGNOSIS — N401 Enlarged prostate with lower urinary tract symptoms: Secondary | ICD-10-CM | POA: Diagnosis not present

## 2021-05-30 DIAGNOSIS — C61 Malignant neoplasm of prostate: Secondary | ICD-10-CM | POA: Diagnosis not present

## 2021-05-30 DIAGNOSIS — R351 Nocturia: Secondary | ICD-10-CM | POA: Diagnosis not present

## 2021-05-30 DIAGNOSIS — R102 Pelvic and perineal pain: Secondary | ICD-10-CM | POA: Diagnosis not present

## 2021-06-03 ENCOUNTER — Encounter: Payer: Self-pay | Admitting: Internal Medicine

## 2021-06-09 ENCOUNTER — Encounter: Payer: Self-pay | Admitting: Internal Medicine

## 2021-06-09 ENCOUNTER — Ambulatory Visit (INDEPENDENT_AMBULATORY_CARE_PROVIDER_SITE_OTHER): Payer: Medicare PPO | Admitting: Internal Medicine

## 2021-06-09 VITALS — BP 146/70 | HR 70 | Temp 98.0°F | Ht 67.0 in | Wt 181.0 lb

## 2021-06-09 DIAGNOSIS — Z0000 Encounter for general adult medical examination without abnormal findings: Secondary | ICD-10-CM | POA: Diagnosis not present

## 2021-06-09 DIAGNOSIS — E782 Mixed hyperlipidemia: Secondary | ICD-10-CM | POA: Diagnosis not present

## 2021-06-09 DIAGNOSIS — C61 Malignant neoplasm of prostate: Secondary | ICD-10-CM

## 2021-06-09 DIAGNOSIS — I10 Essential (primary) hypertension: Secondary | ICD-10-CM

## 2021-06-09 DIAGNOSIS — I2510 Atherosclerotic heart disease of native coronary artery without angina pectoris: Secondary | ICD-10-CM | POA: Diagnosis not present

## 2021-06-09 LAB — CBC WITH DIFFERENTIAL/PLATELET
Basophils Absolute: 0 10*3/uL (ref 0.0–0.1)
Basophils Relative: 0.5 % (ref 0.0–3.0)
Eosinophils Absolute: 0.1 10*3/uL (ref 0.0–0.7)
Eosinophils Relative: 0.8 % (ref 0.0–5.0)
HCT: 40.8 % (ref 39.0–52.0)
Hemoglobin: 13.7 g/dL (ref 13.0–17.0)
Lymphocytes Relative: 21 % (ref 12.0–46.0)
Lymphs Abs: 1.4 10*3/uL (ref 0.7–4.0)
MCHC: 33.7 g/dL (ref 30.0–36.0)
MCV: 98.2 fl (ref 78.0–100.0)
Monocytes Absolute: 0.7 10*3/uL (ref 0.1–1.0)
Monocytes Relative: 9.6 % (ref 3.0–12.0)
Neutro Abs: 4.6 10*3/uL (ref 1.4–7.7)
Neutrophils Relative %: 68.1 % (ref 43.0–77.0)
Platelets: 209 10*3/uL (ref 150.0–400.0)
RBC: 4.16 Mil/uL — ABNORMAL LOW (ref 4.22–5.81)
RDW: 12.5 % (ref 11.5–15.5)
WBC: 6.8 10*3/uL (ref 4.0–10.5)

## 2021-06-09 LAB — COMPREHENSIVE METABOLIC PANEL
ALT: 23 U/L (ref 0–53)
AST: 25 U/L (ref 0–37)
Albumin: 4.7 g/dL (ref 3.5–5.2)
Alkaline Phosphatase: 59 U/L (ref 39–117)
BUN: 16 mg/dL (ref 6–23)
CO2: 30 mEq/L (ref 19–32)
Calcium: 9.7 mg/dL (ref 8.4–10.5)
Chloride: 100 mEq/L (ref 96–112)
Creatinine, Ser: 1.14 mg/dL (ref 0.40–1.50)
GFR: 62.78 mL/min (ref >60.00)
Glucose, Bld: 111 mg/dL — ABNORMAL HIGH (ref 70–99)
Potassium: 4.3 mEq/L (ref 3.5–5.1)
Sodium: 136 mEq/L (ref 135–145)
Total Bilirubin: 1.2 mg/dL (ref 0.2–1.2)
Total Protein: 7.7 g/dL (ref 6.0–8.3)

## 2021-06-09 LAB — LIPID PANEL
Cholesterol: 147 mg/dL (ref 0–200)
HDL: 45.3 mg/dL (ref >39.00)
LDL Cholesterol: 79 mg/dL (ref 0–99)
NonHDL: 101.82
Total CHOL/HDL Ratio: 3
Triglycerides: 116 mg/dL (ref 0.0–149.0)
VLDL: 23.2 mg/dL (ref 0.0–40.0)

## 2021-06-09 MED ORDER — AMLODIPINE BESYLATE 5 MG PO TABS: 5.0000 mg | ORAL_TABLET | Freq: Every day | ORAL | 1 refills | Status: DC

## 2021-06-09 MED ORDER — ROSUVASTATIN CALCIUM 10 MG PO TABS: ORAL_TABLET | ORAL | 1 refills | Status: DC

## 2021-06-09 NOTE — Patient Instructions (Addendum)
-  Nice seeing you today!! ? ?-Lab work today; will notify you once results are available. ? ?-Remember your COVID booster, tdap and final shingles vaccines at the pharmacy. ? ?-Start amlodipine 5 mg daily for your blood pressure. ? ?-Schedule follow up in 6-8 weeks. ? ? ? ? ?

## 2021-06-09 NOTE — Progress Notes (Signed)
? ? ? ?Established Patient Office Visit ? ? ? ? ?This visit occurred during the SARS-CoV-2 public health emergency.  Safety protocols were in place, including screening questions prior to the visit, additional usage of staff PPE, and extensive cleaning of exam room while observing appropriate contact time as indicated for disinfecting solutions.  ? ? ?CC/Reason for Visit: Annual preventive exam and subsequent Medicare wellness visit ? ?HPI: Ian Roman is a 76 y.o. male who is coming in today for the above mentioned reasons. Past Medical History is significant for:  Coronary artery disease status post remote stenting with a low risk stress test in 2019 who follows with cardiology on an annual basis, hyperlipidemia, GERD, hypertension.  He was most diagnosed with prostate cancer in 2021.  He has completed treatment, his latest PSA was 0.2, he still follows with urology routinely.  He has routine eye and dental care.  He will be due for his 5-year colonoscopy this year, his blood pressure remains elevated both at home and in office.  He is overdue for his follow-up with cardiology.  He is overdue for COVID booster, Tdap and second shingles vaccine. ? ?Past Medical/Surgical History: ?Past Medical History:  ?Diagnosis Date  ? Coronary artery disease   ? Status post stenting x2 2007  ? Hyperlipidemia   ? Hypertension   ? Personal history of colonic polyps   ? Prostate cancer ([place])   ? Prostatitis   ? Stroke [facility])   ? tia   ? Vertigo   ? ? ?Past Surgical History:  ?Procedure Laterality Date  ? COLONOSCOPY    ? CORONARY ANGIOPLASTY WITH STENT PLACEMENT    ? x2   2007  ? PROSTATE BIOPSY    ? ? ?Social History: ? reports that he has never smoked. He has never used smokeless tobacco. He reports current alcohol use. He reports that he does not use drugs. ? ?Allergies: ?Allergies  ?Allergen Reactions  ? Zocor [Simvastatin]   ?  Intolerance  ? ? ?Family History:  ?Family History  ?Problem Relation Age of Onset  ? Heart  disease Mother   ? Prostate cancer Father   ? Colon cancer Neg Hx   ? Breast cancer Neg Hx   ? Pancreatic cancer Neg Hx   ? ? ? ?Current Outpatient Medications:  ?  amLODipine (NORVASC) 5 MG tablet, Take 1 tablet (5 mg total) by mouth daily., Disp: 90 tablet, Rfl: 1 ?  aspirin 81 MG tablet, Take 81 mg by mouth daily., Disp: , Rfl:  ?  clopidogrel (PLAVIX) 75 MG tablet, Take 1 tablet by mouth once daily, Disp: 90 tablet, Rfl: 0 ?  fluticasone (FLONASE) 50 MCG/ACT nasal spray, Place 2 sprays into both nostrils daily., Disp: 16 g, Rfl: 5 ?  HYDROcodone-acetaminophen (NORCO) 7.5-325 MG tablet, Take 1 tablet by mouth every 4 (four) hours as needed for moderate pain or severe pain., Disp: 20 tablet, Rfl: 0 ?  Lycopene 10 MG CAPS, Take 1 capsule by mouth daily., Disp: , Rfl:  ?  metoprolol succinate (TOPROL-XL) 25 MG 24 hr tablet, Take 1 tablet (25 mg total) by mouth daily., Disp: 90 tablet, Rfl: 1 ?  Multiple Vitamin tablet, Take by mouth., Disp: , Rfl:  ?  tamsulosin (FLOMAX) 0.4 MG CAPS capsule, Take 1 capsule (0.4 mg total) by mouth daily after supper., Disp: 30 capsule, Rfl: 5 ?  rosuvastatin (CRESTOR) 10 MG tablet, TAKE 1/2 (ONE-HALF) TABLET BY MOUTH ONCE DAILY ., Disp: 90 tablet, Rfl: 1 ? ?  Review of Systems:  ?Constitutional: Denies fever, chills, diaphoresis, appetite change and fatigue.  ?HEENT: Denies photophobia, eye pain, redness, hearing loss, ear pain, congestion, sore throat, rhinorrhea, sneezing, mouth sores, trouble swallowing, neck pain, neck stiffness and tinnitus.   ?Respiratory: Denies SOB, DOE, cough, chest tightness,  and wheezing.   ?Cardiovascular: Denies chest pain, palpitations and leg swelling.  ?Gastrointestinal: Denies nausea, vomiting, abdominal pain, diarrhea, constipation, blood in stool and abdominal distention.  ?Genitourinary: Denies dysuria, urgency, frequency, hematuria, flank pain and difficulty urinating.  ?Endocrine: Denies: hot or cold intolerance, sweats, changes in hair or nails,  polyuria, polydipsia. ?Musculoskeletal: Denies myalgias, back pain, joint swelling, arthralgias and gait problem.  ?Skin: Denies pallor, rash and wound.  ?Neurological: Denies dizziness, seizures, syncope, weakness, light-headedness, numbness and headaches.  ?Hematological: Denies adenopathy. Easy bruising, personal or family bleeding history  ?Psychiatric/Behavioral: Denies suicidal ideation, mood changes, confusion, nervousness, sleep disturbance and agitation ? ? ? ?Physical Exam: ?Vitals:  ? 06/09/21 1036 06/09/21 1039  ?BP: (!) 150/70 (!) 146/70  ?Pulse: 70   ?Temp: 98 ?F (36.7 ?C)   ?TempSrc: Oral   ?SpO2: 99%   ?Weight: 181 lb (82.1 kg)   ?Height: '5\' 7"'$  (1.702 m)   ? ? ?Body mass index is 28.35 kg/m?. ? ? ?Constitutional: NAD, calm, comfortable ?Eyes: PERRL, lids and conjunctivae normal, wears corrective lenses ?ENMT: Mucous membranes are moist. Posterior pharynx clear of any exudate or lesions. Normal dentition. Tympanic membrane is pearly white, no erythema or bulging. ?Neck: normal, supple, no masses, no thyromegaly ?Respiratory: clear to auscultation bilaterally, no wheezing, no crackles. Normal respiratory effort. No accessory muscle use.  ?Cardiovascular: Regular rate and rhythm, no murmurs / rubs / gallops. No extremity edema. 2+ pedal pulses. No carotid bruits.  ?Abdomen: no tenderness, no masses palpated. No hepatosplenomegaly. Bowel sounds positive.  ?Musculoskeletal: no clubbing / cyanosis. No joint deformity upper and lower extremities. Good ROM, no contractures. Normal muscle tone.  ?Skin: no rashes, lesions, ulcers. No induration ?Neurologic: CN 2-12 grossly intact. Sensation intact, DTR normal. Strength 5/5 in all 4.  ?Psychiatric: Normal judgment and insight. Alert and oriented x 3. Normal mood.  ? ?Subsequent Medicare wellness visit ?  ?1. Risk factors, based on past  M,S,F -cardiovascular disease risk factors include age, gender, history of coronary artery disease, hypertension,  hyperlipidemia ?  ?2.  Physical activities: Nothing other than activities of daily living ?  ?3.  Depression/mood: Stable, not depressed  ?  ?4.  Hearing: Worse in the last year, declines audiology evaluation ?  ?5.  ADL's: Independent in all ADLs ?  ?6.  Fall risk: Low fall risk ?  ?7.  Home safety: No problems identified ?  ?8.  Height weight, and visual acuity: height and weight as above, vision: ? ?20/25 right eye, 20/20 left eye, 20/20 eyes together ?  ?9.  Counseling: Advised to update vaccinations ?  ?10. Lab orders based on risk factors: Laboratory update will be reviewed ?  ?11. Referral : GI and cardiology ?  ?12. Care plan: Follow-up with me in 6 weeks ?  ?13. 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  ?14. Screening: Patient provided with a written and personalized 5-10 year screening schedule in the AVS. yes ?  ?15. Provider List Update: PCP, cardiology, urology, GI ? ?16. Advance Directives: Full code ? ? ?17. Opioids: Patient is not on any opioid prescriptions and has no risk factors for a substance use disorder. ? ? ?  [facility] Office Visit from 06/09/2021 in [place] at [facility]  ?PHQ-9 Total Score 3  ? ?  ? ? ?Fall Risk 06/27/2019 10/10/2019 02/27/2020 06/21/2020 06/09/2021  ?Falls in the past year? - - 0 - 0  ?Was there an injury with Fall? - - 0 - 0  ?Fall Risk Category Calculator - - 0 - 0  ?Fall Risk Category - - Low - Low  ?Patient Fall Risk Level Low fall risk Low fall risk - Low fall risk Low fall risk  ?Patient at Risk for Falls Due to - - - - No Fall Risks  ?Fall risk Follow up - - - - Falls evaluation completed  ? ? ? ? ?Impression and Plan: ? ?Encounter for preventive health examination ?-Recommend routine eye and dental care. ?-Immunizations: Overdue for COVID booster, Tdap and second shingles vaccine, he will obtain these at his pharmacy. ?-Healthy lifestyle discussed in detail. ?-Labs to be updated today. ?-Colon cancer  screening: 10/2016, will be due for 5-year callback this year. ?-Breast cancer screening: Not applicable ?-Cervical cancer screening: Not applicable ?-Lung cancer screening: Not applicable ?-Prostate cancer screeni

## 2021-06-10 ENCOUNTER — Telehealth: Payer: Self-pay | Admitting: Internal Medicine

## 2021-06-10 NOTE — Telephone Encounter (Signed)
Pt wife Ian Roman is calling and her husband seen dr Jerilee Hoh yesterday and she would like to know if he suppose to be take 2 bp meds amLODipine (NORVASC) 5 MG tablet and metoprolol succinate (TOPROL-XL) 25 MG 24 hr tablet. Please advise ?

## 2021-06-10 NOTE — Telephone Encounter (Signed)
Primary hypertension  ?- Plan: amLODipine (NORVASC) 5 MG tablet, CBC with Differential/Platelet, Comprehensive metabolic panel ?-Blood pressure is uncontrolled at home and last few in office visits. ?-Add amlodipine 5 mg to his metoprolol.  Return visit in 6 to 8 weeks for follow-up. ? ?Wife is aware per Dr Ledell Noss note 06/09/21. ?

## 2021-08-07 ENCOUNTER — Other Ambulatory Visit: Payer: Self-pay | Admitting: Internal Medicine

## 2021-11-07 ENCOUNTER — Other Ambulatory Visit: Payer: Self-pay | Admitting: Internal Medicine

## 2021-11-21 DIAGNOSIS — U071 COVID-19: Secondary | ICD-10-CM

## 2021-11-21 HISTORY — DX: COVID-19: U07.1

## 2021-11-23 ENCOUNTER — Other Ambulatory Visit: Payer: Self-pay | Admitting: Internal Medicine

## 2021-11-23 DIAGNOSIS — I10 Essential (primary) hypertension: Secondary | ICD-10-CM

## 2021-12-01 DIAGNOSIS — C61 Malignant neoplasm of prostate: Secondary | ICD-10-CM | POA: Diagnosis not present

## 2021-12-03 ENCOUNTER — Ambulatory Visit (INDEPENDENT_AMBULATORY_CARE_PROVIDER_SITE_OTHER): Payer: Medicare PPO | Admitting: Internal Medicine

## 2021-12-03 VITALS — BP 130/70 | HR 94 | Temp 101.5°F | Wt 175.5 lb

## 2021-12-03 DIAGNOSIS — U071 COVID-19: Secondary | ICD-10-CM

## 2021-12-03 DIAGNOSIS — R509 Fever, unspecified: Secondary | ICD-10-CM | POA: Diagnosis not present

## 2021-12-03 LAB — POCT INFLUENZA A/B
Influenza A, POC: NEGATIVE
Influenza B, POC: NEGATIVE

## 2021-12-03 LAB — POC COVID19 BINAXNOW: SARS Coronavirus 2 Ag: POSITIVE — AB

## 2021-12-03 MED ORDER — NIRMATRELVIR/RITONAVIR (PAXLOVID)TABLET: 3.0000 | ORAL_TABLET | Freq: Two times a day (BID) | ORAL | 0 refills | Status: AC

## 2021-12-03 NOTE — Progress Notes (Signed)
Virtual Visit via Telephone Note  I connected with Ian Roman on 12/03/21 at  9:30 AM EDT by telephone and verified that I am speaking with the correct person using two identifiers.   I discussed the limitations, risks, security and privacy concerns of performing an evaluation and management service by telephone and the availability of in person appointments. I also discussed with the patient that there may be a patient responsible charge related to this service. The patient expressed understanding and agreed to proceed.  Location patient: home Location provider: work office Participants present for the call: patient, provider Patient did not have a visit in the prior 7 days to address this/these issue(s).   History of Present Illness:  He had initially come into the office for routine follow-up when he was noted to have a temperature of 101.  He was immediately isolated in a room, asked to wear a mask and questioned about other symptoms.  He states that 2 days ago he woke up with a sore throat, cough productive of white mucus as well as runny nose and congestion.  No known positive contacts, no recent travel, no myalgias.  Flu and COVID test were done in office.  COVID test resulted positive at which point he was asked to go out to his car and this visit was rescheduled as a phone call.   Observations/Objective: Patient sounds cheerful and well on the phone although congested. I do not appreciate any increased work of breathing. Speech and thought processing are grossly intact. Patient reported vitals: Temp of 101.   Current Outpatient Medications:    nirmatrelvir/ritonavir EUA (PAXLOVID) 20 x 150 MG & 10 x '100MG'$  TABS, Take 3 tablets by mouth 2 (two) times daily for 5 days. (Take nirmatrelvir 150 mg two tablets twice daily for 5 days and ritonavir 100 mg one tablet twice daily for 5 days) Patient GFR is 62, Disp: 30 tablet, Rfl: 0   amLODipine (NORVASC) 5 MG tablet, Take 1  tablet by mouth once daily, Disp: 90 tablet, Rfl: 1   aspirin 81 MG tablet, Take 81 mg by mouth daily., Disp: , Rfl:    clopidogrel (PLAVIX) 75 MG tablet, TAKE 1 TABLET BY MOUTH ONCE DAILY . APPOINTMENT REQUIRED FOR FUTURE REFILLS, Disp: 90 tablet, Rfl: 0   fluticasone (FLONASE) 50 MCG/ACT nasal spray, Place 2 sprays into both nostrils daily., Disp: 16 g, Rfl: 5   HYDROcodone-acetaminophen (NORCO) 7.5-325 MG tablet, Take 1 tablet by mouth every 4 (four) hours as needed for moderate pain or severe pain., Disp: 20 tablet, Rfl: 0   Lycopene 10 MG CAPS, Take 1 capsule by mouth daily., Disp: , Rfl:    metoprolol succinate (TOPROL-XL) 25 MG 24 hr tablet, Take 1 tablet by mouth once daily, Disp: 90 tablet, Rfl: 1   Multiple Vitamin tablet, Take by mouth., Disp: , Rfl:    rosuvastatin (CRESTOR) 10 MG tablet, TAKE 1/2 (ONE-HALF) TABLET BY MOUTH ONCE DAILY ., Disp: 90 tablet, Rfl: 1   tamsulosin (FLOMAX) 0.4 MG CAPS capsule, Take 1 capsule (0.4 mg total) by mouth daily after supper., Disp: 30 capsule, Rfl: 5  Review of Systems:  Constitutional: Denies diaphoresis HEENT: Denies photophobia, eye pain, redness,  mouth sores, trouble swallowing, neck pain, neck stiffness and tinnitus.   Respiratory: Denies SOB, DOE, chest tightness,  and wheezing.   Cardiovascular: Denies chest pain, palpitations and leg swelling.  Gastrointestinal: Denies nausea, vomiting, abdominal pain, diarrhea, constipation, blood in stool and abdominal distention.  Genitourinary: Denies dysuria, urgency, frequency, hematuria, flank pain and difficulty urinating.  Endocrine: Denies: hot or cold intolerance, sweats, changes in hair or nails, polyuria, polydipsia. Musculoskeletal: Denies  back pain, joint swelling, arthralgias and gait problem.  Skin: Denies pallor, rash and wound.  Neurological: Denies dizziness, seizures, syncope, numbness and headaches.  Hematological: Denies adenopathy. Easy bruising, personal or family bleeding  history  Psychiatric/Behavioral: Denies suicidal ideation, mood changes, confusion, nervousness, sleep disturbance and agitation   Assessment and Plan:  Fever, unspecified fever cause - Plan: POC Influenza A/B, POC COVID-19 BinaxNow, nirmatrelvir/ritonavir EUA (PAXLOVID) 20 x 150 MG & 10 x '100MG'$  TABS  COVID-19 - Plan: nirmatrelvir/ritonavir EUA (PAXLOVID) 20 x 150 MG & 10 x '100MG'$  TABS  -He may also use OTC medications such as antihistamines, decongestants, pain relievers, guaifenesin. -We have reviewed quarantine period of 5 days. -We have discussed symptoms that would promote ED evaluation. -He knows to follow with [place] if symptoms fail to resolve. -He has been advised to discontinue use of his rosuvastatin for the next week while on Paxlovid.   I discussed the assessment and treatment plan with the patient. The patient was provided an opportunity to ask questions and all were answered. The patient agreed with the plan and demonstrated an understanding of the instructions.   The patient was advised to call back or seek an in-person evaluation if the symptoms worsen or if the condition fails to improve as anticipated.  I provided 22 minutes of non-face-to-face time during this encounter.   Lelon Frohlich, MD [facility] at [facility]

## 2021-12-05 ENCOUNTER — Telehealth: Payer: Self-pay | Admitting: Internal Medicine

## 2021-12-05 NOTE — Telephone Encounter (Signed)
Spouse called to ask if CMA could call back to discuss refills.    [facility] states Pt must make an appt for future refills. Spouse would like to know how long they should wait to come in, since Pt tested positive for Covid.  Re:  clopidogrel (PLAVIX) 75 MG tablet

## 2021-12-08 MED ORDER — CLOPIDOGREL BISULFATE 75 MG PO TABS: ORAL_TABLET | ORAL | 0 refills | Status: DC

## 2021-12-08 NOTE — Telephone Encounter (Signed)
Okay to refill? 

## 2021-12-08 NOTE — Addendum Note (Signed)
Addended by: Elza Rafter D on: 12/08/2021 04:59 PM   Modules accepted: Orders

## 2021-12-08 NOTE — Telephone Encounter (Signed)
Pt aware that f/u needed for chronic conditions. States tested Covid + 9/13, symptoms started early in the week. Appt scheduled, advised wife that mask is needed.

## 2021-12-09 ENCOUNTER — Ambulatory Visit: Payer: Medicare PPO | Admitting: Internal Medicine

## 2021-12-16 ENCOUNTER — Ambulatory Visit (INDEPENDENT_AMBULATORY_CARE_PROVIDER_SITE_OTHER): Payer: Medicare PPO | Admitting: Internal Medicine

## 2021-12-16 ENCOUNTER — Encounter: Payer: Self-pay | Admitting: Internal Medicine

## 2021-12-16 VITALS — BP 120/70 | HR 78 | Temp 98.8°F | Wt 173.7 lb

## 2021-12-16 DIAGNOSIS — E785 Hyperlipidemia, unspecified: Secondary | ICD-10-CM

## 2021-12-16 DIAGNOSIS — I2510 Atherosclerotic heart disease of native coronary artery without angina pectoris: Secondary | ICD-10-CM | POA: Diagnosis not present

## 2021-12-16 DIAGNOSIS — C61 Malignant neoplasm of prostate: Secondary | ICD-10-CM

## 2021-12-16 DIAGNOSIS — E782 Mixed hyperlipidemia: Secondary | ICD-10-CM

## 2021-12-16 DIAGNOSIS — Z23 Encounter for immunization: Secondary | ICD-10-CM

## 2021-12-16 DIAGNOSIS — I10 Essential (primary) hypertension: Secondary | ICD-10-CM

## 2021-12-16 MED ORDER — AMLODIPINE BESYLATE 5 MG PO TABS: 5.0000 mg | ORAL_TABLET | Freq: Every day | ORAL | 1 refills | Status: DC

## 2021-12-16 MED ORDER — ROSUVASTATIN CALCIUM 10 MG PO TABS: ORAL_TABLET | ORAL | 1 refills | Status: DC

## 2021-12-16 NOTE — Progress Notes (Signed)
Established Patient Office Visit     CC/Reason for Visit: Follow-up chronic medical conditions  HPI: Ian Roman is a 76 y.o. male who is coming in today for the above mentioned reasons. Past Medical History is significant for: Hypertension, hyperlipidemia, GERD, coronary artery disease and prostate cancer.  On September 13 he was diagnosed with COVID, he has recovered well, he did complete treatment course of Paxlovid.  He recently saw urology and his PSA was 0.1.  He has not seen his cardiologist in some time.  He is feeling well and has no acute concerns or complaints.   Past Medical/Surgical History: Past Medical History:  Diagnosis Date   Coronary artery disease    Status post stenting x2 2007   Hyperlipidemia    Hypertension    Personal history of colonic polyps    Prostate cancer ([place])    Prostatitis    Stroke ([place])    tia    Vertigo     Past Surgical History:  Procedure Laterality Date   COLONOSCOPY     CORONARY ANGIOPLASTY WITH STENT PLACEMENT     x2   2007   PROSTATE BIOPSY      Social History:  reports that he has never smoked. He has never used smokeless tobacco. He reports current alcohol use. He reports that he does not use drugs.  Allergies: Allergies  Allergen Reactions   Zocor [Simvastatin]     Intolerance    Family History:  Family History  Problem Relation Age of Onset   Heart disease Mother    Prostate cancer Father    Colon cancer Neg Hx    Breast cancer Neg Hx    Pancreatic cancer Neg Hx      Current Outpatient Medications:    clopidogrel (PLAVIX) 75 MG tablet, TAKE 1 TABLET BY MOUTH ONCE DAILY . APPOINTMENT REQUIRED FOR FUTURE REFILLS, Disp: 90 tablet, Rfl: 0   fluticasone (FLONASE) 50 MCG/ACT nasal spray, Place 2 sprays into both nostrils daily., Disp: 16 g, Rfl: 5   metoprolol succinate (TOPROL-XL) 25 MG 24 hr tablet, Take 1 tablet by mouth once daily, Disp: 90 tablet, Rfl: 1   Multiple Vitamin tablet, Take by mouth.,  Disp: , Rfl:    amLODipine (NORVASC) 5 MG tablet, Take 1 tablet (5 mg total) by mouth daily., Disp: 90 tablet, Rfl: 1   rosuvastatin (CRESTOR) 10 MG tablet, TAKE 1/2 (ONE-HALF) TABLET BY MOUTH ONCE DAILY ., Disp: 90 tablet, Rfl: 1  Review of Systems:  Constitutional: Denies fever, chills, diaphoresis, appetite change and fatigue.  HEENT: Denies photophobia, eye pain, redness, hearing loss, ear pain, congestion, sore throat, rhinorrhea, sneezing, mouth sores, trouble swallowing, neck pain, neck stiffness and tinnitus.   Respiratory: Denies SOB, DOE, chest tightness,  and wheezing.   Cardiovascular: Denies chest pain, palpitations and leg swelling.  Gastrointestinal: Denies nausea, vomiting, abdominal pain, diarrhea, constipation, blood in stool and abdominal distention.  Genitourinary: Denies dysuria, urgency, frequency, hematuria, flank pain and difficulty urinating.  Endocrine: Denies: hot or cold intolerance, sweats, changes in hair or nails, polyuria, polydipsia. Musculoskeletal: Denies myalgias, back pain, joint swelling, arthralgias and gait problem.  Skin: Denies pallor, rash and wound.  Neurological: Denies dizziness, seizures, syncope, weakness, light-headedness, numbness and headaches.  Hematological: Denies adenopathy. Easy bruising, personal or family bleeding history  Psychiatric/Behavioral: Denies suicidal ideation, mood changes, confusion, nervousness, sleep disturbance and agitation    Physical Exam: Vitals:   12/16/21 0826  BP: 120/70  Pulse: 78  Temp: 98.8 F (37.1 C)  TempSrc: Oral  SpO2: 98%  Weight: 173 lb 11.2 oz (78.8 kg)    Body mass index is 27.21 kg/m.   Constitutional: NAD, calm, comfortable Eyes: PERRL, lids and conjunctivae normal, wears corrective lenses ENMT: Mucous membranes are moist.  Respiratory: clear to auscultation bilaterally, no wheezing, no crackles. Normal respiratory effort. No accessory muscle use.  Cardiovascular: Regular rate and  rhythm, no murmurs / rubs / gallops. No extremity edema. Psychiatric: Normal judgment and insight. Alert and oriented x 3. Normal mood.    Impression and Plan:  Malignant neoplasm of prostate ([place])  Primary hypertension - Plan: amLODipine (NORVASC) 5 MG tablet  Hyperlipidemia, mixed - Plan: rosuvastatin (CRESTOR) 10 MG tablet  Dyslipidemia  Coronary artery disease involving native coronary artery of native heart without angina pectoris  -He has recovered well from his recent COVID infection, just has a residual mildly productive cough that is not very bothersome. -Blood pressure is well controlled. -His last lipid check in March showed excellent control on rosuvastatin. -He recently saw his urologist for follow-up of his prostate cancer and everything there is going well, recent PSA was 0.1. -He has not seen his cardiologist in years, have advised that he touch base for routine follow-up.  Time spent:32 minutes reviewing chart, interviewing and examining patient and formulating plan of care.     Lelon Frohlich, MD [facility]

## 2021-12-16 NOTE — Addendum Note (Signed)
Addended by: Westley Hummer B on: 12/16/2021 09:45 AM   Modules accepted: Orders

## 2021-12-19 ENCOUNTER — Ambulatory Visit: Payer: Medicare PPO

## 2021-12-30 ENCOUNTER — Telehealth: Payer: Self-pay | Admitting: Internal Medicine

## 2021-12-30 NOTE — Telephone Encounter (Signed)
Pt's spouse called to say they received a letter in the mail stating they missed the appt for 12/19/21.  Spouse states she spoke to J.Burgess, RN, who promised to cancel this appt because MD wanted them to come in on 12/16/21 instead, which they did.  Pt received his Flu shot on that day, as well.  Spouse would like the record to be corrected and show that Pt did not miss his visit.

## 2022-01-09 ENCOUNTER — Encounter: Payer: Self-pay | Admitting: Internal Medicine

## 2022-03-12 ENCOUNTER — Other Ambulatory Visit: Payer: Self-pay | Admitting: Internal Medicine

## 2022-03-30 ENCOUNTER — Ambulatory Visit: Payer: Medicare PPO | Admitting: Internal Medicine

## 2022-03-30 ENCOUNTER — Encounter: Payer: Self-pay | Admitting: Internal Medicine

## 2022-03-30 VITALS — BP 142/72 | HR 51 | Ht 69.0 in | Wt 173.5 lb

## 2022-03-30 DIAGNOSIS — Z9861 Coronary angioplasty status: Secondary | ICD-10-CM

## 2022-03-30 DIAGNOSIS — Z7902 Long term (current) use of antithrombotics/antiplatelets: Secondary | ICD-10-CM | POA: Diagnosis not present

## 2022-03-30 DIAGNOSIS — I2510 Atherosclerotic heart disease of native coronary artery without angina pectoris: Secondary | ICD-10-CM | POA: Diagnosis not present

## 2022-03-30 DIAGNOSIS — Z86010 Personal history of colonic polyps: Secondary | ICD-10-CM

## 2022-03-30 NOTE — Patient Instructions (Addendum)
We have made you an appointment with Dr. Irish Lack for February 1st at 9:40AM.    Once you have seen cardiology please call us back to set up a pre-visit and colonoscopy appointment.   I appreciate the opportunity to care for you. Silvano Rusk, MD, [facility]

## 2022-03-30 NOTE — Progress Notes (Signed)
Ian Roman 77 y.o. 1945-07-14 [id number]  Assessment & Plan:   Encounter Diagnoses  Name Primary?   Personal history of colonic polyps-adenomas Yes   Long term current use of antithrombotics/antiplatelets - clopidogrel    S/P PTCA (percutaneous transluminal coronary angioplasty)    Coronary artery disease involving native coronary artery of native heart without angina pectoris    Reasonable to repeat a colonoscopy but he has not seen cardiology since 2021.  I think the prudent thing is for him to have follow-up there.  He was seen to me he will be okay to hold his clopidogrel 5 days prior but I would like him to have a follow-up visit with Dr. Ed Blalock or colleagues.  We have placed a referral for him to be seen again.  Once he has had that visit we will schedule colonoscopy barring unforeseen issues.  He could have a previsit rather than another doctor visit.    Subjective:   Chief Complaint: History of colon polyps  HPI 77 year old African-American man with a history of adenomatous colon polyps and prostate cancer treated with radiation, who presents with his wife to discuss repeating a colonoscopy.  He is now 89 and has a history of 3 adenomatous colon polyps removed in 2015 with a maximum size of 8 mm and then a diminutive polyp removed but not recovered in 2018.  He denies any problems with abdominal pain, upper GI or lower GI signs or symptoms.  He saw Dr. Isaac Bliss of primary care last fall.  He had COVID in September and recovered without difficulty.   No angina or dyspnea on exertion or any cardiac symptoms that I can discern from history.  Lab Results  Component Value Date   WBC 6.8 06/09/2021   HGB 13.7 06/09/2021   HCT 40.8 06/09/2021   MCV 98.2 06/09/2021   PLT 209.0 06/09/2021    Allergies  Allergen Reactions   Zocor [Simvastatin]     Intolerance   Current Meds  Medication Sig   amLODipine (NORVASC) 5 MG tablet Take 1 tablet (5 mg total)  by mouth daily.   clopidogrel (PLAVIX) 75 MG tablet TAKE 1 TABLET BY MOUTH ONCE DAILY . APPOINTMENT REQUIRED FOR FUTURE REFILLS   fluticasone (FLONASE) 50 MCG/ACT nasal spray Place 2 sprays into both nostrils daily.   metoprolol succinate (TOPROL-XL) 25 MG 24 hr tablet Take 1 tablet by mouth once daily   Multiple Vitamin tablet Take by mouth.   rosuvastatin (CRESTOR) 10 MG tablet TAKE 1/2 (ONE-HALF) TABLET BY MOUTH ONCE DAILY .   Past Medical History:  Diagnosis Date   Coronary artery disease    Status post stenting x2 2007   COVID-19 11/2021   Hyperlipidemia    Hypertension    Personal history of colonic polyps    Prostate cancer ([place])    Prostatitis    Stroke ([place])    tia    Vertigo    Past Surgical History:  Procedure Laterality Date   COLONOSCOPY     CORONARY ANGIOPLASTY WITH STENT PLACEMENT     x2   2007   PROSTATE BIOPSY     Social History   Social History Narrative   Retired    Married   Never tobacco no drug use rare alcohol perhaps once a month   family history includes Heart disease in his mother; Prostate cancer in his father.   Review of Systems Vertigo - positional, chronic and he tolerates this by moving slowly. Otherwise as per  HPI or negative. Objective:   Physical Exam '@BP'$  (!) 142/72   Pulse (!) 51   Ht '5\' 9"'$  (1.753 m)   Wt 173 lb 8 oz (78.7 kg)   BMI 25.62 kg/m @  General:  NAD Eyes:   anicteric Lungs:  clear Heart::  S1S2 no rubs, murmurs or gallops Abdomen:  soft and nontender, BS+ Ext:   no edema, cyanosis or clubbing    Data Reviewed:  See HPI

## 2022-04-23 ENCOUNTER — Ambulatory Visit: Payer: Medicare PPO | Attending: Interventional Cardiology | Admitting: Interventional Cardiology

## 2022-04-23 ENCOUNTER — Encounter: Payer: Self-pay | Admitting: Internal Medicine

## 2022-04-23 ENCOUNTER — Encounter: Payer: Self-pay | Admitting: Interventional Cardiology

## 2022-04-23 VITALS — BP 128/56 | HR 69 | Ht 69.0 in | Wt 168.4 lb

## 2022-04-23 DIAGNOSIS — I25118 Atherosclerotic heart disease of native coronary artery with other forms of angina pectoris: Secondary | ICD-10-CM | POA: Diagnosis not present

## 2022-04-23 DIAGNOSIS — Z01810 Encounter for preprocedural cardiovascular examination: Secondary | ICD-10-CM

## 2022-04-23 DIAGNOSIS — E782 Mixed hyperlipidemia: Secondary | ICD-10-CM

## 2022-04-23 DIAGNOSIS — I10 Essential (primary) hypertension: Secondary | ICD-10-CM

## 2022-04-23 NOTE — Progress Notes (Signed)
Cardiology Office Note   Date:  04/23/2022   ID:  Walid, Haig 1945/09/06, MRN [id number]  PCP:  Isaac Bliss, Rayford Halsted, MD    No chief complaint on file.  CAD  Wt Readings from Last 3 Encounters:  04/23/22 168 lb 6.4 oz (76.4 kg)  03/30/22 173 lb 8 oz (78.7 kg)  12/16/21 173 lb 11.2 oz (78.8 kg)       History of Present Illness: Ian Roman is a 77 y.o. male   was previously followed in [place] for CAD.  He had 2 stents placed at [facility] in 2007.  He was working in [place] at the time.  He lives in [place] and retired in 2012.   He wanted followup in [place].   In 2007, he was walking to get his car.  He felt that everything was getting slow.  He had no pain, but after 1 minute, everything came back to normal.  He had a stress test and then cath.  He had 2 stents placed.  A Vision stent, 3.5 x 12 was placed in the LAD. 2.5 x 18 mm Cypher stent placed in the RCA.  Cardiology office note from [facility] [place] ( 04/17/14- Dr. Thad Ranger) states "2 Cypher stents in each artery."   In May 2019, he was walking in the mall with his wife and he felt warm while he was walking.  He felt like he needed air.  He then threw up.  He had taken some hydrocodone cough syrup just before walking. THis prompted him to ask for a cardiologist here.     Negative stress test in 07/2017: 1. EF 53%, normal wall motion.  Would consider confirming EF with echo.  2. No evidence for ischemia or infarction by perfusion images.  3. Hypertensive BP response on ETT, no ischemic ST segment changes.    Low risk study.   I last saw him in 2021.  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  He feels that the treatment affected his memory.  He thinks he had radiation.  He needs a colonoscopy.   Denies : Chest pain. Dizziness. Leg edema. Nitroglycerin use. Orthopnea. Palpitations. Paroxysmal nocturnal dyspnea. Shortness of breath. Syncope.    No longer consistently walking over the past few months.  Prior to that, he was  walking several miles/day without any problems.      Past Medical History:  Diagnosis Date   Coronary artery disease    Status post stenting x2 2007   COVID-19 11/2021   Hyperlipidemia    Hypertension    Personal history of colonic polyps    Prostate cancer ([place])    Prostatitis    Stroke ([place])    tia    Vertigo     Past Surgical History:  Procedure Laterality Date   COLONOSCOPY     CORONARY ANGIOPLASTY WITH STENT PLACEMENT     x2   2007   PROSTATE BIOPSY       Current Outpatient Medications  Medication Sig Dispense Refill   amLODipine (NORVASC) 5 MG tablet Take 1 tablet (5 mg total) by mouth daily. 90 tablet 1   clopidogrel (PLAVIX) 75 MG tablet TAKE 1 TABLET BY MOUTH ONCE DAILY . APPOINTMENT REQUIRED FOR FUTURE REFILLS 90 tablet 0   fluticasone (FLONASE) 50 MCG/ACT nasal spray Place 2 sprays into both nostrils daily. 16 g 5   metoprolol succinate (TOPROL-XL) 25 MG 24 hr tablet Take 1 tablet by mouth once daily 90 tablet  1   Multiple Vitamin tablet Take by mouth.     rosuvastatin (CRESTOR) 10 MG tablet TAKE 1/2 (ONE-HALF) TABLET BY MOUTH ONCE DAILY . 90 tablet 1   No current facility-administered medications for this visit.    Allergies:   Zocor [simvastatin]    Social History:  The patient  reports that he has never smoked. He has never used smokeless tobacco. He reports current alcohol use. He reports that he does not use drugs.   Family History:  The patient's family history includes Heart disease in his mother; Prostate cancer in his father.    ROS:  Please see the history of present illness.   Otherwise, review of systems are positive for urinary frequency; decreased memory.   All other systems are reviewed and negative.    PHYSICAL EXAM: VS:  BP (!) 154/60   Pulse 69   Ht '5\' 9"'$  (1.753 m)   Wt 168 lb 6.4 oz (76.4 kg)   SpO2 98%   BMI 24.87 kg/m  , BMI Body mass index is 24.87 kg/m. GEN: Well nourished, well developed, in no acute distress HEENT:  normal Neck: no JVD, carotid bruits, or masses Cardiac: RRR; no murmurs, rubs, or gallops,no edema  Respiratory:  clear to auscultation bilaterally, normal work of breathing GI: soft, nontender, nondistended, + BS MS: no deformity or atrophy Skin: warm and dry, no rash Neuro:  Strength and sensation are intact; suspect some mild memory issues; tends to repeat statements Psych: euthymic mood, full affect   EKG:   The ekg ordered today demonstrates normal ECG   Recent Labs: 06/09/2021: ALT 23; BUN 16; Creatinine, Ser 1.14; Hemoglobin 13.7; Platelets 209.0; Potassium 4.3; Sodium 136   Lipid Panel    Component Value Date/Time   CHOL 147 06/09/2021 1120   TRIG 116.0 06/09/2021 1120   HDL 45.30 06/09/2021 1120   CHOLHDL 3 06/09/2021 1120   VLDL 23.2 06/09/2021 1120   LDLCALC 79 06/09/2021 1120   LDLCALC 78 02/27/2020 1335     Other studies Reviewed: Additional studies/ records that were reviewed today with results demonstrating:prior cath records reviewed .  Labs reviewed from March 2023.  Total cholesterol 147 HDL 45 LDL 79 triglycerides 116 creatinine 1.1 potassium 4.3   ASSESSMENT AND PLAN:  CAD: No angina.  COntinue clopidogrel monotherapy.  Okay to hold the Plavix 5 days prior to the colonoscopy. Exercise target noted below.  Can incorporate light weights.  Preoperative cardiovascular exam: No further cardiac testing needed before colonoscopy HTN: High reading today.  Readings at home are better per his report.  He cannot remember the readings.  Hyperlipidemia: The current medical regimen is effective;  continue present plan and medications. Rosuvastatin.     Current medicines are reviewed at length with the patient today.  The patient concerns regarding his medicines were addressed.  The following changes have been made:  No change  Labs/ tests ordered today include:  No orders of the defined types were placed in this encounter.   Recommend 150 minutes/week of  aerobic exercise Low fat, low carb, high fiber diet recommended  Disposition:   FU in 1 year   Signed, Larae Grooms, MD  04/23/2022 9:48 AM    [facility] [address], [place], [place]  [address] Phone: [phone]; Fax: [phone]

## 2022-04-23 NOTE — Patient Instructions (Signed)

## 2022-04-30 ENCOUNTER — Ambulatory Visit (AMBULATORY_SURGERY_CENTER): Payer: Medicare PPO

## 2022-04-30 ENCOUNTER — Encounter: Payer: Self-pay | Admitting: Internal Medicine

## 2022-04-30 VITALS — Ht 69.0 in | Wt 170.0 lb

## 2022-04-30 DIAGNOSIS — Z86010 Personal history of colonic polyps: Secondary | ICD-10-CM

## 2022-04-30 NOTE — Progress Notes (Signed)
No egg or soy allergy known to patient  No issues known to pt with past sedation with any surgeries or procedures Patient denies ever being told they had issues or difficulty with intubation  No FH of Malignant Hyperthermia Pt is not on diet pills Pt is not on  home 02  Pt is on blood thinners Plavix Pt denies issues with constipation  No A fib or A flutter Have any cardiac testing pending--no Pt instructed to use Singlecare.com or GoodRx for a price reduction on prep  Patient's chart reviewed by Osvaldo Angst CNRA prior to previsit and patient appropriate for the [facility].  Previsit completed and red dot placed by patient's name on their procedure day (on provider's schedule).

## 2022-05-11 ENCOUNTER — Telehealth: Payer: Self-pay | Admitting: Internal Medicine

## 2022-05-11 NOTE — Telephone Encounter (Signed)
All concerns addressed and questions answered

## 2022-05-11 NOTE — Telephone Encounter (Signed)
Inbound call from patient stating that he is scheduled to have a colonoscopy on 2/29 with Dr. Carlean Purl. Patient is requesting a call back from the nurse to discuss some concerns he has about his teeth. Please advise.

## 2022-05-21 ENCOUNTER — Encounter: Payer: Self-pay | Admitting: Internal Medicine

## 2022-05-21 ENCOUNTER — Ambulatory Visit (AMBULATORY_SURGERY_CENTER): Payer: Medicare PPO | Admitting: Internal Medicine

## 2022-05-21 VITALS — BP 117/68 | HR 72 | Temp 96.8°F | Resp 12 | Ht 69.0 in | Wt 170.0 lb

## 2022-05-21 DIAGNOSIS — Z09 Encounter for follow-up examination after completed treatment for conditions other than malignant neoplasm: Secondary | ICD-10-CM | POA: Diagnosis not present

## 2022-05-21 DIAGNOSIS — E785 Hyperlipidemia, unspecified: Secondary | ICD-10-CM | POA: Diagnosis not present

## 2022-05-21 DIAGNOSIS — Z86010 Personal history of colonic polyps: Secondary | ICD-10-CM | POA: Diagnosis not present

## 2022-05-21 DIAGNOSIS — I2510 Atherosclerotic heart disease of native coronary artery without angina pectoris: Secondary | ICD-10-CM | POA: Diagnosis not present

## 2022-05-21 DIAGNOSIS — Z1211 Encounter for screening for malignant neoplasm of colon: Secondary | ICD-10-CM | POA: Diagnosis not present

## 2022-05-21 DIAGNOSIS — I10 Essential (primary) hypertension: Secondary | ICD-10-CM | POA: Diagnosis not present

## 2022-05-21 MED ORDER — SODIUM CHLORIDE 0.9 % IV SOLN: 500.0000 mL | INTRAVENOUS | Status: DC

## 2022-05-21 NOTE — Progress Notes (Signed)
Uneventful anesthetic. Report to pacu rn. Vss. Care resumed by rn.

## 2022-05-21 NOTE — Op Note (Signed)
[facility] Patient Name: Ian Roman Procedure Date: 05/21/2022 12:11 PM MRN: [id number] Endoscopist: Gatha Mayer , MD, [phone] Age: 77 Referring MD:  Date of Birth: 10/07/1945 Gender: Male Account #: [id number] Procedure:                Colonoscopy Indications:              Surveillance: Personal history of adenomatous                            polyps on last colonoscopy > 5 years ago, Last                            colonoscopy: 2018 Medicines:                Monitored Anesthesia Care Procedure:                Pre-Anesthesia Assessment:                           - Prior to the procedure, a History and Physical                            was performed, and patient medications and                            allergies were reviewed. The patient's tolerance of                            previous anesthesia was also reviewed. The risks                            and benefits of the procedure and the sedation                            options and risks were discussed with the patient.                            All questions were answered, and informed consent                            was obtained. Prior Anticoagulants: The patient                            last took Plavix (clopidogrel) 5 days prior to the                            procedure. ASA Grade Assessment: III - A patient                            with severe systemic disease. After reviewing the                            risks and benefits, the patient was deemed in  satisfactory condition to undergo the procedure.                           After obtaining informed consent, the colonoscope                            was passed under direct vision. Throughout the                            procedure, the patient's blood pressure, pulse, and                            oxygen saturations were monitored continuously. The                            Olympus CF-HQ190L ([id number])  Colonoscope was                            introduced through the anus and advanced to the the                            cecum, identified by appendiceal orifice and                            ileocecal valve. The colonoscopy was performed                            without difficulty. The patient tolerated the                            procedure well. The quality of the bowel                            preparation was good. The ileocecal valve,                            appendiceal orifice, and rectum were photographed.                            The bowel preparation used was Miralax via split                            dose instruction. Scope In: 12:17:09 PM Scope Out: 12:26:15 PM Scope Withdrawal Time: 0 hours 6 minutes 15 seconds  Total Procedure Duration: 0 hours 9 minutes 6 seconds  Findings:                 The perianal and digital rectal examinations were                            normal.                           Scattered diverticula were found in the left colon.  The exam was otherwise without abnormality on                            direct and retroflexion views. Complications:            No immediate complications. Estimated Blood Loss:     Estimated blood loss: none. Impression:               - Diverticulosis in the left colon.                           - The examination was otherwise normal on direct                            and retroflexion views.                           - No specimens collected.                           - Personal history of colonic polyps. Recommendation:           - Patient has a contact number available for                            emergencies. The signs and symptoms of potential                            delayed complications were discussed with the                            patient. Return to normal activities tomorrow.                            Written discharge instructions were provided to the                             patient.                           - Resume previous diet.                           - Continue present medications.                           - Resume Plavix (clopidogrel) at prior dose today.                           - No repeat colonoscopy due to age and the absence                            of colonic polyps. Gatha Mayer, MD 05/21/2022 12:31:47 PM This report has been signed electronically.

## 2022-05-21 NOTE — Progress Notes (Signed)
Pt's states no medical or surgical changes since previsit or office visit. 

## 2022-05-21 NOTE — Patient Instructions (Addendum)
No polyps or cancer seen.  You do not need further routine repeat colonoscopy testing. If you have problems that would warrant a colonoscopy (hope not) we can consider it.  I appreciate the opportunity to care for you. Gatha Mayer, MD, [facility]  Handout on diverticulosis given to patient.  Resume previous diet and continue present medications - resume Plavix at previous dose today     YOU HAD AN ENDOSCOPIC PROCEDURE TODAY AT [place]:   Refer to the procedure report that was given to you for any specific questions about what was found during the examination.  If the procedure report does not answer your questions, please call your gastroenterologist to clarify.  If you requested that your care partner not be given the details of your procedure findings, then the procedure report has been included in a sealed envelope for you to review at your convenience later.  YOU SHOULD EXPECT: Some feelings of bloating in the abdomen. Passage of more gas than usual.  Walking can help get rid of the air that was put into your GI tract during the procedure and reduce the bloating. If you had a lower endoscopy (such as a colonoscopy or flexible sigmoidoscopy) you may notice spotting of blood in your stool or on the toilet paper. If you underwent a bowel prep for your procedure, you may not have a normal bowel movement for a few days.  Please Note:  You might notice some irritation and congestion in your nose or some drainage.  This is from the oxygen used during your procedure.  There is no need for concern and it should clear up in a day or so.  SYMPTOMS TO REPORT IMMEDIATELY:  Following lower endoscopy (colonoscopy or flexible sigmoidoscopy):  Excessive amounts of blood in the stool  Significant tenderness or worsening of abdominal pains  Swelling of the abdomen that is new, acute  Fever of 100F or higher  For urgent or emergent issues, a gastroenterologist can be reached at any hour  by calling [phone]. Do not use MyChart messaging for urgent concerns.    DIET:  We do recommend a small meal at first, but then you may proceed to your regular diet.  Drink plenty of fluids but you should avoid alcoholic beverages for 24 hours.  ACTIVITY:  You should plan to take it easy for the rest of today and you should NOT DRIVE or use heavy machinery until tomorrow (because of the sedation medicines used during the test).    FOLLOW UP: Our staff will call the number listed on your records the next business day following your procedure.  We will call around 7:15- 8:00 am to check on you and address any questions or concerns that you may have regarding the information given to you following your procedure. If we do not reach you, we will leave a message.     If any biopsies were taken you will be contacted by phone or by letter within the next 1-3 weeks.  Please call us at [phone] if you have not heard about the biopsies in 3 weeks.    SIGNATURES/CONFIDENTIALITY: You and/or your care partner have signed paperwork which will be entered into your electronic medical record.  These signatures attest to the fact that that the information above on your After Visit Summary has been reviewed and is understood.  Full responsibility of the confidentiality of this discharge information lies with you and/or your care-partner.

## 2022-05-21 NOTE — Progress Notes (Signed)
[facility] History and Physical   Primary Care Physician:  Isaac Bliss, Rayford Halsted, MD   Reason for Procedure:   Hx polyps  Plan:    colonoscopy     HPI: Ian Roman is a 77 y.o. male for surveillance colonoscopy  Plavix held x 5 days  10/11/2013 - 3 adenomas max 8 mm  11/13/2016 diminutive transverse polyp removed not recovered - repeat colon 2023 (prior polyps also)  Past Medical History:  Diagnosis Date   Coronary artery disease    Status post stenting x2 2007   COVID-19 11/2021   Hyperlipidemia    Hypertension    Personal history of colonic polyps    Prostate cancer ([place])    Prostatitis    Stroke ([place])    tia    Vertigo     Past Surgical History:  Procedure Laterality Date   COLONOSCOPY     CORONARY ANGIOPLASTY WITH STENT PLACEMENT     x2   2007   PROSTATE BIOPSY      Prior to Admission medications   Medication Sig Start Date End Date Taking? Authorizing Provider  amLODipine (NORVASC) 5 MG tablet Take 1 tablet (5 mg total) by mouth daily. 12/16/21  Yes Isaac Bliss, Rayford Halsted, MD  metoprolol succinate (TOPROL-XL) 25 MG 24 hr tablet Take 1 tablet by mouth once daily 11/10/21  Yes Isaac Bliss, Rayford Halsted, MD  rosuvastatin (CRESTOR) 10 MG tablet TAKE 1/2 (ONE-HALF) TABLET BY MOUTH ONCE DAILY . 12/16/21  Yes Isaac Bliss, Rayford Halsted, MD  clopidogrel (PLAVIX) 75 MG tablet TAKE 1 TABLET BY MOUTH ONCE DAILY . APPOINTMENT REQUIRED FOR FUTURE REFILLS 03/12/22   Isaac Bliss, Rayford Halsted, MD  fluticasone [facility]) 50 MCG/ACT nasal spray Place 2 sprays into both nostrils daily. 05/22/14   Marletta Lor, MD  Multiple Vitamin tablet Take by mouth. 01/03/08   [provider]    Current Outpatient Medications  Medication Sig Dispense Refill   amLODipine (NORVASC) 5 MG tablet Take 1 tablet (5 mg total) by mouth daily. 90 tablet 1   metoprolol succinate (TOPROL-XL) 25 MG 24 hr tablet Take 1 tablet by mouth once daily 90 tablet 1    rosuvastatin (CRESTOR) 10 MG tablet TAKE 1/2 (ONE-HALF) TABLET BY MOUTH ONCE DAILY . 90 tablet 1   clopidogrel (PLAVIX) 75 MG tablet TAKE 1 TABLET BY MOUTH ONCE DAILY . APPOINTMENT REQUIRED FOR FUTURE REFILLS 90 tablet 0   fluticasone (FLONASE) 50 MCG/ACT nasal spray Place 2 sprays into both nostrils daily. 16 g 5   Multiple Vitamin tablet Take by mouth.     Current Facility-Administered Medications  Medication Dose Route Frequency Provider Last Rate Last Admin   0.9 %  sodium chloride infusion  500 mL Intravenous Continuous Gatha Mayer, MD        Allergies as of 05/21/2022 - Review Complete 05/21/2022  Allergen Reaction Noted   Zocor [simvastatin]  09/28/2013    Family History  Problem Relation Age of Onset   Heart disease Mother    Prostate cancer Father    Colon cancer Neg Hx    Breast cancer Neg Hx    Pancreatic cancer Neg Hx    Colon polyps Neg Hx    Esophageal cancer Neg Hx    Rectal cancer Neg Hx    Stomach cancer Neg Hx     Social History   Socioeconomic History   Marital status: Married    Spouse name: Not on file   Number of  children: Not on file   Years of education: Not on file   Highest education level: Not on file  Occupational History    Comment: retired  Tobacco Use   Smoking status: Never   Smokeless tobacco: Never  Vaping Use   Vaping Use: Never used  Substance and Sexual Activity   Alcohol use: Yes    Comment: once a month per pt   Drug use: No   Sexual activity: Not Currently  Other Topics Concern   Not on file  Social History Narrative   Retired    Married   Never tobacco no drug use rare alcohol perhaps once a month   Social Determinants of Radio broadcast assistant Strain: Not on file  Food Insecurity: Not on file  Transportation Needs: Not on file  Physical Activity: Not on file  Stress: Not on file  Social Connections: Not on file  Intimate Partner Violence: Not on file    Review of Systems:  All other review of  systems negative except as mentioned in the HPI.  Physical Exam: Vital signs BP (!) 151/87   Pulse 63   Temp (!) 96.8 F (36 C)   Ht '5\' 9"'$  (1.753 m)   Wt 170 lb (77.1 kg)   SpO2 99%   BMI 25.10 kg/m   General:   Alert,  Well-developed, well-nourished, pleasant and cooperative in NAD Lungs:  Clear throughout to auscultation.   Heart:  Regular rate and rhythm; no murmurs, clicks, rubs,  or gallops. Abdomen:  Soft, nontender and nondistended. Normal bowel sounds.   Neuro/Psych:  Alert and cooperative. Normal mood and affect. A and O x 3   '@Ashanti Littles'$  Simonne Maffucci, MD, [facility] [phone] (pager) 05/21/2022 12:05 PM@

## 2022-05-22 ENCOUNTER — Telehealth: Payer: Self-pay | Admitting: *Deleted

## 2022-05-22 NOTE — Telephone Encounter (Signed)
No answer on follow up call. Left message.   

## 2022-05-25 ENCOUNTER — Encounter: Payer: Medicare PPO | Admitting: Internal Medicine

## 2022-05-26 ENCOUNTER — Other Ambulatory Visit: Payer: Self-pay | Admitting: Internal Medicine

## 2022-06-17 ENCOUNTER — Other Ambulatory Visit: Payer: Self-pay | Admitting: Internal Medicine

## 2022-07-24 DIAGNOSIS — N401 Enlarged prostate with lower urinary tract symptoms: Secondary | ICD-10-CM | POA: Diagnosis not present

## 2022-07-24 DIAGNOSIS — C61 Malignant neoplasm of prostate: Secondary | ICD-10-CM | POA: Diagnosis not present

## 2022-07-24 DIAGNOSIS — R102 Pelvic and perineal pain: Secondary | ICD-10-CM | POA: Diagnosis not present

## 2022-07-24 DIAGNOSIS — R350 Frequency of micturition: Secondary | ICD-10-CM | POA: Diagnosis not present

## 2022-08-19 ENCOUNTER — Ambulatory Visit (INDEPENDENT_AMBULATORY_CARE_PROVIDER_SITE_OTHER): Payer: Medicare PPO | Admitting: Internal Medicine

## 2022-08-19 ENCOUNTER — Encounter: Payer: Self-pay | Admitting: Internal Medicine

## 2022-08-19 VITALS — BP 120/78 | HR 73 | Temp 98.2°F | Ht 69.5 in | Wt 171.6 lb

## 2022-08-19 DIAGNOSIS — I10 Essential (primary) hypertension: Secondary | ICD-10-CM | POA: Diagnosis not present

## 2022-08-19 DIAGNOSIS — Z0000 Encounter for general adult medical examination without abnormal findings: Secondary | ICD-10-CM | POA: Diagnosis not present

## 2022-08-19 DIAGNOSIS — I2510 Atherosclerotic heart disease of native coronary artery without angina pectoris: Secondary | ICD-10-CM

## 2022-08-19 DIAGNOSIS — Z1159 Encounter for screening for other viral diseases: Secondary | ICD-10-CM

## 2022-08-19 DIAGNOSIS — E782 Mixed hyperlipidemia: Secondary | ICD-10-CM

## 2022-08-19 DIAGNOSIS — G459 Transient cerebral ischemic attack, unspecified: Secondary | ICD-10-CM | POA: Diagnosis not present

## 2022-08-19 DIAGNOSIS — E785 Hyperlipidemia, unspecified: Secondary | ICD-10-CM | POA: Diagnosis not present

## 2022-08-19 DIAGNOSIS — R972 Elevated prostate specific antigen [PSA]: Secondary | ICD-10-CM

## 2022-08-19 DIAGNOSIS — R413 Other amnesia: Secondary | ICD-10-CM

## 2022-08-19 LAB — CBC WITH DIFFERENTIAL/PLATELET
Basophils Absolute: 0 10*3/uL (ref 0.0–0.1)
Basophils Relative: 0.3 % (ref 0.0–3.0)
Eosinophils Absolute: 0 10*3/uL (ref 0.0–0.7)
Eosinophils Relative: 0.4 % (ref 0.0–5.0)
HCT: 38.2 % — ABNORMAL LOW (ref 39.0–52.0)
Hemoglobin: 12.7 g/dL — ABNORMAL LOW (ref 13.0–17.0)
Lymphocytes Relative: 9.9 % — ABNORMAL LOW (ref 12.0–46.0)
Lymphs Abs: 1.1 10*3/uL (ref 0.7–4.0)
MCHC: 33.2 g/dL (ref 30.0–36.0)
MCV: 99.3 fl (ref 78.0–100.0)
Monocytes Absolute: 0.8 10*3/uL (ref 0.1–1.0)
Monocytes Relative: 7.3 % (ref 3.0–12.0)
Neutro Abs: 9.1 10*3/uL — ABNORMAL HIGH (ref 1.4–7.7)
Neutrophils Relative %: 82.1 % — ABNORMAL HIGH (ref 43.0–77.0)
Platelets: 208 10*3/uL (ref 150.0–400.0)
RBC: 3.85 Mil/uL — ABNORMAL LOW (ref 4.22–5.81)
RDW: 12 % (ref 11.5–15.5)
WBC: 11.1 10*3/uL — ABNORMAL HIGH (ref 4.0–10.5)

## 2022-08-19 LAB — LIPID PANEL
Cholesterol: 124 mg/dL (ref 0–200)
HDL: 41.1 mg/dL (ref >39.00)
LDL Cholesterol: 68 mg/dL (ref 0–99)
NonHDL: 82.93
Total CHOL/HDL Ratio: 3
Triglycerides: 77 mg/dL (ref 0.0–149.0)
VLDL: 15.4 mg/dL (ref 0.0–40.0)

## 2022-08-19 LAB — COMPREHENSIVE METABOLIC PANEL
ALT: 15 U/L (ref 0–53)
AST: 19 U/L (ref 0–37)
Albumin: 4.5 g/dL (ref 3.5–5.2)
Alkaline Phosphatase: 61 U/L (ref 39–117)
BUN: 19 mg/dL (ref 6–23)
CO2: 27 mEq/L (ref 19–32)
Calcium: 9.3 mg/dL (ref 8.4–10.5)
Chloride: 103 mEq/L (ref 96–112)
Creatinine, Ser: 1.17 mg/dL (ref 0.40–1.50)
GFR: 60.34 mL/min (ref >60.00)
Glucose, Bld: 91 mg/dL (ref 70–99)
Potassium: 4.6 mEq/L (ref 3.5–5.1)
Sodium: 139 mEq/L (ref 135–145)
Total Bilirubin: 1.4 mg/dL — ABNORMAL HIGH (ref 0.2–1.2)
Total Protein: 7.6 g/dL (ref 6.0–8.3)

## 2022-08-19 LAB — TSH: TSH: 0.91 u[IU]/mL (ref 0.35–5.50)

## 2022-08-19 LAB — VITAMIN B12: Vitamin B-12: 266 pg/mL (ref 211–911)

## 2022-08-19 LAB — VITAMIN D 25 HYDROXY (VIT D DEFICIENCY, FRACTURES): VITD: 31.93 ng/mL (ref 30.00–100.00)

## 2022-08-19 LAB — PSA: PSA: 0.4 ng/mL (ref 0.10–4.00)

## 2022-08-19 NOTE — Progress Notes (Signed)
Established Patient Office Visit     CC/Reason for Visit: Annual preventive exam, subsequent Medicare visit, discuss acute concern  HPI: Ian Roman is a 77 y.o. male who is coming in today for the above mentioned reasons. Past Medical History is significant for: Hypertension, hyperlipidemia, GERD, prostate cancer, coronary artery disease.  He is followed by urology and cardiology.  He is here with his wife.  They have noticed progressive memory issues.  She notes that it is mainly with short-term memory.  He has scored poorly on memory testing today.  He is overdue for an eye exam, no hearing difficulty.  Due for Tdap and RSV vaccines.  Colon and prostate cancer screening is up-to-date.   Past Medical/Surgical History: Past Medical History:  Diagnosis Date   Coronary artery disease    Status post stenting x2 2007   COVID-19 11/2021   Hyperlipidemia    Hypertension    Personal history of colonic polyps    Prostate cancer ([facility])    Prostatitis    Stroke ([facility])    tia    Vertigo     Past Surgical History:  Procedure Laterality Date   COLONOSCOPY     CORONARY ANGIOPLASTY WITH STENT PLACEMENT     x2   2007   PROSTATE BIOPSY      Social History:  reports that he has never smoked. He has never used smokeless tobacco. He reports current alcohol use. He reports that he does not use drugs.  Allergies: Allergies  Allergen Reactions   Zocor [Simvastatin]     Intolerance    Family History:  Family History  Problem Relation Age of Onset   Heart disease Mother    Prostate cancer Father    Colon cancer Neg Hx    Breast cancer Neg Hx    Pancreatic cancer Neg Hx    Colon polyps Neg Hx    Esophageal cancer Neg Hx    Rectal cancer Neg Hx    Stomach cancer Neg Hx      Current Outpatient Medications:    amLODipine (NORVASC) 5 MG tablet, Take 1 tablet (5 mg total) by mouth daily., Disp: 90 tablet, Rfl: 1   clopidogrel (PLAVIX) 75 MG tablet, TAKE 1 TABLET BY MOUTH  ONCE DAILY ., Disp: 90 tablet, Rfl: 1   fluticasone (FLONASE) 50 MCG/ACT nasal spray, Place 2 sprays into both nostrils daily., Disp: 16 g, Rfl: 5   metoprolol succinate (TOPROL-XL) 25 MG 24 hr tablet, Take 1 tablet by mouth once daily, Disp: 90 tablet, Rfl: 0   Multiple Vitamin tablet, Take by mouth., Disp: , Rfl:    rosuvastatin (CRESTOR) 10 MG tablet, TAKE 1/2 (ONE-HALF) TABLET BY MOUTH ONCE DAILY ., Disp: 90 tablet, Rfl: 1  Review of Systems:  Negative unless indicated in HPI.   Physical Exam: Vitals:   08/19/22 1032  BP: 120/78  Pulse: 73  Temp: 98.2 F (36.8 C)  TempSrc: Oral  SpO2: 99%  Weight: 171 lb 9.6 oz (77.8 kg)  Height: 5' 9.5" (1.765 m)    Body mass index is 24.98 kg/m.   Physical Exam Vitals reviewed.  Constitutional:      General: He is not in acute distress.    Appearance: Normal appearance. He is not ill-appearing, toxic-appearing or diaphoretic.  HENT:     Head: Normocephalic.     Right Ear: Tympanic membrane, ear canal and external ear normal. There is no impacted cerumen.     Left Ear: Tympanic  membrane, ear canal and external ear normal. There is no impacted cerumen.     Nose: Nose normal.     Mouth/Throat:     Mouth: Mucous membranes are moist.     Pharynx: Oropharynx is clear. No oropharyngeal exudate or posterior oropharyngeal erythema.  Eyes:     General: No scleral icterus.       Right eye: No discharge.        Left eye: No discharge.     Conjunctiva/sclera: Conjunctivae normal.     Pupils: Pupils are equal, round, and reactive to light.  Neck:     Vascular: No carotid bruit.  Cardiovascular:     Rate and Rhythm: Normal rate and regular rhythm.     Pulses: Normal pulses.     Heart sounds: Normal heart sounds.  Pulmonary:     Effort: Pulmonary effort is normal. No respiratory distress.     Breath sounds: Normal breath sounds.  Abdominal:     General: Abdomen is flat. Bowel sounds are normal.     Palpations: Abdomen is soft.   Musculoskeletal:        General: Normal range of motion.     Cervical back: Normal range of motion.  Skin:    General: Skin is warm and dry.  Neurological:     General: No focal deficit present.     Mental Status: He is alert and oriented to person, place, and time. Mental status is at baseline.  Psychiatric:        Mood and Affect: Mood normal.        Behavior: Behavior normal.        Thought Content: Thought content normal.        Judgment: Judgment normal.    Subsequent Medicare wellness visit   1. Risk factors, based on past  M,S,F - Cardiac Risk Factors include: advanced age (>39men, >63 women);hypertension   2.  Physical activities: Dietary issues and exercise activities discussed:      3.  Depression/mood:  Flowsheet Row Office Visit from 08/19/2022 in [facility] Total Score 0        4.  ADL's:    08/19/2022   10:25 AM  In your present state of health, do you have any difficulty performing the following activities:  Hearing? 0  Vision? 0  Difficulty concentrating or making decisions? 1  Walking or climbing stairs? 0  Dressing or bathing? 0  Doing errands, shopping? 0  Preparing Food and eating ? N  Using the Toilet? N  In the past six months, have you accidently leaked urine? N  Do you have problems with loss of bowel control? N  Managing your Medications? N  Managing your Finances? N  Housekeeping or managing your Housekeeping? N     5.  Fall risk:     06/21/2020    7:54 AM 06/09/2021   10:41 AM 12/03/2021   10:01 AM 12/16/2021    9:43 AM 08/19/2022   10:27 AM  Fall Risk  Falls in the past year?  0 0 0 0  Was there an injury with Fall?  0 0 0 0  Fall Risk Category Calculator  0 0 0 0  Fall Risk Category (Retired)  Low Low Low   (RETIRED) Patient Fall Risk Level Low fall risk Low fall risk Low fall risk Low fall risk   Patient at Risk for Falls Due to  No Fall Risks No Fall Risks No Fall  Risks   Fall risk Follow  up  Falls evaluation completed Falls evaluation completed Falls evaluation completed Falls evaluation completed     6.  Home safety: No problems identified   7.  Height weight, and visual acuity: height and weight as above, vision/hearing: Vision Screening   Right eye Left eye Both eyes  Without correction     With correction 20/20 20/20 20/20      8.  Counseling: Counseling given: Not Answered    9. Lab orders based on risk factors: Laboratory update will be reviewed   10. Cognitive assessment:        08/19/2022   10:28 AM  6CIT Screen  What Year? 0 points  What month? 3 points  What time? 0 points  Count back from 20 2 points  Months in reverse 4 points  Repeat phrase 10 points  Total Score 19 points     11. Screening: Patient provided with a written and personalized 5-10 year screening schedule in the AVS. Health Maintenance  Topic Date Due   DTaP/Tdap/Td vaccine (2 - Td or Tdap) 01/07/2021   COVID-19 Vaccine (4 - 2023-24 season) 11/21/2021   Flu Shot  10/22/2022   Medicare Annual Wellness Visit  08/19/2023   Pneumonia Vaccine  Completed   Hepatitis C Screening  Completed   Zoster (Shingles) Vaccine  Completed   HPV Vaccine  Aged Out   Colon Cancer Screening  Discontinued    12. Provider List Update: Patient Care Team    Relationship Specialty Notifications Start End  Philip Aspen, Limmie Patricia, MD PCP - General Internal Medicine  03/02/18   Corky Crafts, MD PCP - Cardiology Cardiology Admissions 02/10/19      13. Advance Directives: Does Patient Have a Medical Advance Directive?: No Would patient like information on creating a medical advance directive?: No - Patient declined  14. Opioids: Patient is not on any opioid prescriptions and has no risk factors for a substance use disorder.   15.   Goals      Protect My Health     Timeframe:  Long-Range Goal Priority:  Medium Start Date:                             Expected End Date:                        Follow Up Date 08/19/2023    - schedule and keep appointment for annual check-up    Why is this important?   Screening tests can find diseases early when they are easier to treat.  Your doctor or nurse will talk with you about which tests are important for you.  Getting shots for common diseases like the flu and shingles will help prevent them.     Notes:          I have personally reviewed and noted the following in the patient's chart:   Medical and social history Use of alcohol, tobacco or illicit drugs  Current medications and supplements Functional ability and status Nutritional status Physical activity Advanced directives List of other physicians Hospitalizations, surgeries, and ER visits in previous 12 months Vitals Screenings to include cognitive, depression, and falls Referrals and appointments  In addition, I have reviewed and discussed with patient certain preventive protocols, quality metrics, and best practice recommendations. A written personalized care plan for preventive services as well as general preventive health recommendations were  provided to patient.   Impression and Plan:  Medicare annual wellness visit, subsequent -     PSA; Future  Primary hypertension -     CBC with Differential/Platelet; Future -     Comprehensive metabolic panel; Future  Coronary artery disease involving native coronary artery of native heart without angina pectoris  Dyslipidemia  Elevated PSA  Hyperlipidemia, mixed -     Lipid panel; Future  TIA (transient ischemic attack)  Memory loss -     TSH; Future -     Vitamin B12; Future -     VITAMIN D 25 Hydroxy (Vit-D Deficiency, Fractures); Future -     Ambulatory referral to Neurology  Encounter for hepatitis C screening test for low risk patient -     Hepatitis C antibody; Future  -Recommend routine eye and dental care. -Healthy lifestyle discussed in detail. -Labs to be updated today. -Prostate  cancer screening: Normal PSA 5/24. Followed by urology. Health Maintenance  Topic Date Due   DTaP/Tdap/Td vaccine (2 - Td or Tdap) 01/07/2021   COVID-19 Vaccine (4 - 2023-24 season) 11/21/2021   Flu Shot  10/22/2022   Medicare Annual Wellness Visit  08/19/2023   Pneumonia Vaccine  Completed   Hepatitis C Screening  Completed   Zoster (Shingles) Vaccine  Completed   HPV Vaccine  Aged Out   Colon Cancer Screening  Discontinued   -Significant memory issues.  Check TSH, B12.  Refer to neurology.        Chaya Jan, MD [facility]

## 2022-08-20 ENCOUNTER — Other Ambulatory Visit: Payer: Self-pay | Admitting: Internal Medicine

## 2022-08-20 LAB — HEPATITIS C ANTIBODY: Hepatitis C Ab: NONREACTIVE

## 2022-08-27 ENCOUNTER — Ambulatory Visit: Payer: Medicare PPO | Admitting: Physician Assistant

## 2022-08-27 ENCOUNTER — Ambulatory Visit: Payer: Medicare PPO

## 2022-12-14 ENCOUNTER — Ambulatory Visit (INDEPENDENT_AMBULATORY_CARE_PROVIDER_SITE_OTHER): Payer: Medicare PPO

## 2022-12-14 DIAGNOSIS — Z23 Encounter for immunization: Secondary | ICD-10-CM

## 2022-12-28 ENCOUNTER — Other Ambulatory Visit: Payer: Self-pay | Admitting: Internal Medicine

## 2022-12-28 DIAGNOSIS — I10 Essential (primary) hypertension: Secondary | ICD-10-CM

## 2022-12-31 NOTE — Telephone Encounter (Signed)
Pt is calling checking on status of refills

## 2023-01-01 NOTE — Telephone Encounter (Signed)
[facility] calling back to check on refill request for amLODipine (NORVASC) 5 MG tablet clopidogrel (PLAVIX) 75 MG tablet. Advised provider is OOO today

## 2023-02-15 ENCOUNTER — Telehealth: Payer: Self-pay | Admitting: *Deleted

## 2023-02-15 DIAGNOSIS — E782 Mixed hyperlipidemia: Secondary | ICD-10-CM

## 2023-02-15 MED ORDER — ROSUVASTATIN CALCIUM 10 MG PO TABS: ORAL_TABLET | ORAL | 1 refills | Status: AC

## 2023-02-15 NOTE — Telephone Encounter (Signed)
Refill was sent

## 2023-03-08 ENCOUNTER — Other Ambulatory Visit: Payer: Self-pay | Admitting: Internal Medicine

## 2023-03-22 DIAGNOSIS — C61 Malignant neoplasm of prostate: Secondary | ICD-10-CM | POA: Diagnosis not present

## 2023-03-29 DIAGNOSIS — R351 Nocturia: Secondary | ICD-10-CM | POA: Diagnosis not present

## 2023-03-29 DIAGNOSIS — C61 Malignant neoplasm of prostate: Secondary | ICD-10-CM | POA: Diagnosis not present

## 2023-04-30 ENCOUNTER — Ambulatory Visit: Payer: Medicare PPO | Attending: Cardiovascular Disease | Admitting: Cardiovascular Disease

## 2023-04-30 ENCOUNTER — Encounter: Payer: Self-pay | Admitting: Cardiovascular Disease

## 2023-04-30 VITALS — BP 120/62 | HR 63 | Ht 69.5 in

## 2023-04-30 DIAGNOSIS — E782 Mixed hyperlipidemia: Secondary | ICD-10-CM

## 2023-04-30 DIAGNOSIS — I25118 Atherosclerotic heart disease of native coronary artery with other forms of angina pectoris: Secondary | ICD-10-CM | POA: Diagnosis not present

## 2023-04-30 DIAGNOSIS — I10 Essential (primary) hypertension: Secondary | ICD-10-CM | POA: Diagnosis not present

## 2023-04-30 NOTE — Patient Instructions (Addendum)
 Medication Instructions:  No changes *If you need a refill on your cardiac medications before your next appointment, please call your pharmacy*   Lab Work: none   Testing/Procedures: Your physician has requested that you have an echocardiogram. Echocardiography is a painless test that uses sound waves to create images of your heart. It provides your doctor with information about the size and shape of your heart and how well your heart's chambers and valves are working. This procedure takes approximately one hour. There are no restrictions for this procedure. Please do NOT wear cologne, perfume, aftershave, or lotions (deodorant is allowed). Please arrive 15 minutes prior to your appointment time.  Please note: We ask at that you not bring children with you during ultrasound (echo/ vascular) testing. Due to room size and safety concerns, children are not allowed in the ultrasound rooms during exams. Our front office staff cannot provide observation of children in our lobby area while testing is being conducted. An adult accompanying a patient to their appointment will only be allowed in the ultrasound room at the discretion of the ultrasound technician under special circumstances. We apologize for any inconvenience.   Follow-Up: At [facility], you and your health needs are our priority.  As part of our continuing mission to provide you with exceptional heart care, we have created designated Provider Care Teams.  These Care Teams include your primary Cardiologist (physician) and Advanced Practice Providers (APPs -  Physician Assistants and Nurse Practitioners) who all work together to provide you with the care you need, when you need it.  We recommend signing up for the patient portal called MyChart.  Sign up information is provided on this After Visit Summary.  MyChart is used to connect with patients for Virtual Visits (Telemedicine).  Patients are able to view lab/test results,  encounter notes, upcoming appointments, etc.  Non-urgent messages can be sent to your provider as well.   To learn more about what you can do with MyChart, go to [email or web address].    Your next appointment:   12 month(s)  Provider:   Lonni Cash, MD      1st Floor: - Lobby - Registration  - Pharmacy  - Lab - Cafe  2nd Floor: - PV Lab - Diagnostic Testing (echo, CT, nuclear med)  3rd Floor: - Vacant  4th Floor: - TCTS (cardiothoracic surgery) - AFib Clinic - Structural Heart Clinic - Vascular Surgery  - Vascular Ultrasound  5th Floor: - HeartCare Cardiology (general and EP) - Clinical Pharmacy for coumadin, hypertension, lipid, weight-loss medications, and med management appointments    Valet parking services will be available as well.

## 2023-04-30 NOTE — Progress Notes (Signed)
 Chief Complaint  Patient presents with   Follow-up    CAD   History of Present Illness: 78 yo male with history of CAD, HLD, HTN, TIA and prostate cancer who is here today for follow up. He has been followed in our office by Dr. Dann. He had a bare metal stent placed in the LAD and a Cypher drug eluting stent placed in the RCA at [facility] in 2007. Stress test in 2019 with no ischemia.   He is here today for follow up. The patient denies any chest pain, dyspnea, palpitations, lower extremity edema, orthopnea, PND, dizziness, near syncope or syncope.   Primary Care Physician: Theophilus Andrews, Tully GRADE, MD   Past Medical History:  Diagnosis Date   Coronary artery disease    Status post stenting x2 2007   COVID-19 11/2021   Hyperlipidemia    Hypertension    Personal history of colonic polyps    Prostate cancer (HCC)    Prostatitis    Stroke (HCC)    tia    Vertigo     Past Surgical History:  Procedure Laterality Date   COLONOSCOPY     CORONARY ANGIOPLASTY WITH STENT PLACEMENT     x2   2007   PROSTATE BIOPSY      Current Outpatient Medications  Medication Sig Dispense Refill   amLODipine  (NORVASC ) 5 MG tablet Take 1 tablet by mouth once daily 90 tablet 1   clopidogrel  (PLAVIX ) 75 MG tablet Take 1 tablet by mouth once daily 90 tablet 1   fluticasone  (FLONASE ) 50 MCG/ACT nasal spray Place 2 sprays into both nostrils daily. 16 g 5   metoprolol  succinate (TOPROL -XL) 25 MG 24 hr tablet Take 1 tablet by mouth once daily 90 tablet 1   Multiple Vitamin tablet Take by mouth.     rosuvastatin  (CRESTOR ) 10 MG tablet TAKE 1/2 (ONE-HALF) TABLET BY MOUTH ONCE DAILY . 90 tablet 1   No current facility-administered medications for this visit.    Allergies  Allergen Reactions   Zocor [Simvastatin]     Intolerance    Social History   Socioeconomic History   Marital status: Married    Spouse name: Not on file   Number of children: Not on file   Years of education: Not on file    Highest education level: Not on file  Occupational History    Comment: retired  Tobacco Use   Smoking status: Never   Smokeless tobacco: Never  Vaping Use   Vaping status: Never Used  Substance and Sexual Activity   Alcohol use: Yes    Comment: once a month per pt   Drug use: No   Sexual activity: Not Currently  Other Topics Concern   Not on file  Social History Narrative   Retired    Married   Never tobacco no drug use rare alcohol perhaps once a month   Social Drivers of Corporate Investment Banker Strain: Low Risk  (08/19/2022)   Overall Financial Resource Strain (CARDIA)    Difficulty of Paying Living Expenses: Not hard at all  Food Insecurity: No Food Insecurity (08/19/2022)   Hunger Vital Sign    Worried About Running Out of Food in the Last Year: Never true    Ran Out of Food in the Last Year: Never true  Transportation Needs: No Transportation Needs (08/19/2022)   PRAPARE - Administrator, Civil Service (Medical): No    Lack of Transportation (Non-Medical): No  Physical  Activity: Sufficiently Active (08/19/2022)   Exercise Vital Sign    Days of Exercise per Week: 5 days    Minutes of Exercise per Session: 30 min  Stress: No Stress Concern Present (08/19/2022)   [employer] of Occupational Health - Occupational Stress Questionnaire    Feeling of Stress : Not at all  Social Connections: Moderately Isolated (08/19/2022)   Social Connection and Isolation Panel [NHANES]    Frequency of Communication with Friends and Family: Twice a week    Frequency of Social Gatherings with Friends and Family: Once a week    Attends Religious Services: Never    Database Administrator or Organizations: No    Attends Banker Meetings: Never    Marital Status: Married  Catering Manager Violence: Not At Risk (08/19/2022)   Humiliation, Afraid, Rape, and Kick questionnaire    Fear of Current or Ex-Partner: No    Emotionally Abused: No    Physically Abused:  No    Sexually Abused: No    Family History  Problem Relation Age of Onset   Heart disease Mother    Prostate cancer Father    Colon cancer Neg Hx    Breast cancer Neg Hx    Pancreatic cancer Neg Hx    Colon polyps Neg Hx    Esophageal cancer Neg Hx    Rectal cancer Neg Hx    Stomach cancer Neg Hx     Review of Systems:  As stated in the HPI and otherwise negative.   BP 120/62   Pulse 63   Ht 5' 9.5 (1.765 m)   SpO2 94%   BMI 24.98 kg/m   Physical Examination: General: Well developed, well nourished, NAD  HEENT: OP clear, mucus membranes moist  SKIN: warm, dry. No rashes. Neuro: No focal deficits  Musculoskeletal: Muscle strength 5/5 all ext  Psychiatric: Mood and affect normal  Neck: No JVD, no carotid bruits, no thyromegaly, no lymphadenopathy.  Lungs:Clear bilaterally, no wheezes, rhonci, crackles Cardiovascular: Regular rate and rhythm. No murmurs, gallops or rubs. Abdomen:Soft. Bowel sounds present. Non-tender.  Extremities: No lower extremity edema. Pulses are 2 + in the bilateral DP/PT.  EKG:  EKG is ordered today. The ekg ordered today demonstrates  EKG Interpretation Date/Time:  Friday April 30 2023 09:24:36 EST Ventricular Rate:  63 PR Interval:  150 QRS Duration:  98 QT Interval:  400 QTC Calculation: 409 R Axis:   77  Text Interpretation: Normal sinus rhythm with sinus arrhythmia Normal ECG No previous ECGs available Confirmed by Verlin Bruckner [id number]) on 04/30/2023 9:34:04 AM    Recent Labs: 08/19/2022: ALT 15; BUN 19; Creatinine, Ser 1.17; Hemoglobin 12.7; Platelets 208.0; Potassium 4.6; Sodium 139; TSH 0.91   Lipid Panel    Component Value Date/Time   CHOL 124 08/19/2022 1057   TRIG 77.0 08/19/2022 1057   HDL 41.10 08/19/2022 1057   CHOLHDL 3 08/19/2022 1057   VLDL 15.4 08/19/2022 1057   LDLCALC 68 08/19/2022 1057   LDLCALC 78 02/27/2020 1335     Wt Readings from Last 3 Encounters:  08/19/22 77.8 kg  05/21/22 77.1 kg   04/30/22 77.1 kg    Assessment and Plan:   1. CAD without angina: No chest pain suggestive of angina. Continue Plavix , Crestor  and metoprolol . Will arrange an echo to assess LV function.   2. HTN: BP is well controlled. Continue metoprolol   3. Hyperlipidemia: LDL 68 in May 2024. Continue Crestor   Labs/ tests ordered today include:  Orders Placed This Encounter  Procedures   EKG 12-Lead   ECHOCARDIOGRAM COMPLETE   Disposition:   F/U with me in 12 months  Signed, Lonni Cash, MD, [facility] 04/30/2023 10:18 AM    [facility] [address] [place], [place], [place]  [address] Phone: [phone]; Fax: [phone]

## 2023-05-06 ENCOUNTER — Encounter (HOSPITAL_COMMUNITY): Payer: Self-pay | Admitting: *Deleted

## 2023-05-06 ENCOUNTER — Other Ambulatory Visit: Payer: Self-pay

## 2023-05-06 ENCOUNTER — Ambulatory Visit: Payer: Self-pay | Admitting: Internal Medicine

## 2023-05-06 ENCOUNTER — Ambulatory Visit: Payer: Medicare PPO | Admitting: Internal Medicine

## 2023-05-06 ENCOUNTER — Emergency Department (HOSPITAL_COMMUNITY)
Admission: EM | Admit: 2023-05-06 | Discharge: 2023-05-06 | Disposition: A | Discharge status: Home / Self Care | Payer: Medicare PPO | Attending: Emergency Medicine | Admitting: Emergency Medicine

## 2023-05-06 DIAGNOSIS — R200 Anesthesia of skin: Secondary | ICD-10-CM | POA: Diagnosis not present

## 2023-05-06 DIAGNOSIS — R202 Paresthesia of skin: Secondary | ICD-10-CM | POA: Insufficient documentation

## 2023-05-06 NOTE — Telephone Encounter (Signed)
Patient has arrived at the ED

## 2023-05-06 NOTE — Telephone Encounter (Signed)
Patient to go to the ED per Dr Ardyth Harps.  Wife and patient verbally agreed.

## 2023-05-06 NOTE — ED Provider Notes (Signed)
[place] EMERGENCY DEPART[facility] AT [facility] Provider Note   CSN: [id number] Arrival date & time: 05/06/23  1003     History  Chief Complaint  Patient presents with   Numbness    ROI Ian Roman is a 78 y.o. male.  78 yo M with a chief complaints of right-sided arm tingling.  Tells me that he feels it on the upper arm and it goes down into his pinky finger primarily.  This been going on for at least a few weeks but is much more persistent over the past couple nights.  Says usually he wakes up and feels the symptoms and then he shakes his arm around and it gets better.  Does not have it for the rest of the day.  Denies pain to the wrist elbow shoulder or neck.  Denies trauma.  Denies change to his sleeping situation.  He denies weakness to the arm.        Home Medications Prior to Admission medications   Medication Sig Start Date End Date Taking? Authorizing Provider  amLODipine (NORVASC) 5 MG tablet Take 1 tablet by mouth once daily 01/04/23   Philip Aspen, Limmie Patricia, MD  clopidogrel (PLAVIX) 75 MG tablet Take 1 tablet by mouth once daily 01/04/23   Philip Aspen, Limmie Patricia, MD  fluticasone [facility]) 50 MCG/ACT nasal spray Place 2 sprays into both nostrils daily. 05/22/14   Gordy Savers, MD  metoprolol succinate (TOPROL-XL) 25 MG 24 hr tablet Take 1 tablet by mouth once daily 03/08/23   Philip Aspen, Limmie Patricia, MD  Multiple Vitamin tablet Take by mouth. 01/03/08   [provider]  rosuvastatin (CRESTOR) 10 MG tablet TAKE 1/2 (ONE-HALF) TABLET BY MOUTH ONCE DAILY . 02/15/23   Philip Aspen, Limmie Patricia, MD      Allergies    Zocor [simvastatin]    Review of Systems   Review of Systems  Physical Exam Updated Vital Signs BP (!) 137/94   Pulse (!) 52   Temp 97.8 F (36.6 C) (Oral)   Resp 18   Wt 77.6 kg   SpO2 100%   BMI 24.89 kg/m  Physical Exam Vitals and nursing note reviewed.  Constitutional:      Appearance: He is  well-developed.  HENT:     Head: Normocephalic and atraumatic.  Eyes:     Pupils: Pupils are equal, round, and reactive to light.  Neck:     Vascular: No JVD.  Cardiovascular:     Rate and Rhythm: Normal rate and regular rhythm.     Heart sounds: No murmur heard.    No friction rub. No gallop.  Pulmonary:     Effort: No respiratory distress.     Breath sounds: No wheezing.  Abdominal:     General: There is no distension.     Tenderness: There is no abdominal tenderness. There is no guarding or rebound.  Musculoskeletal:        General: Normal range of motion.     Cervical back: Normal range of motion and neck supple.     Comments: Pulse motor and sensation intact to the right upper extremity.  Reflexes are 2+.  There is no clonus.  5 out of 5 strength.  Negative Tinel's test.  Negative Spurling's.  Skin:    Coloration: Skin is not pale.     Findings: No rash.  Neurological:     Mental Status: He is alert and oriented to person, place, and time.  Psychiatric:  Behavior: Behavior normal.     ED Results / Procedures / Treatments   Labs (all labs ordered are listed, but only abnormal results are displayed) Labs Reviewed - No data to display  EKG None  Radiology No results found.  Procedures Procedures    Medications Ordered in ED Medications - No data to display  ED Course/ Medical Decision Making/ A&P                                 Medical Decision Making  78 yo M with a chief complaints of his arm tingling when he wakes up from sleep.  This has been off and on for a few weeks now.  More persistent over the past couple days.  Not having symptoms now.  He has a benign exam.  Likely has some paresthesias.  Follow-up with his family doctor.  Refer to neuro.   12:39 PM:  I have discussed the diagnosis/risks/treatment options with the patient and family.  Evaluation and diagnostic testing in the emergency department does not suggest an emergent condition  requiring admission or immediate intervention beyond what has been performed at this time.  They will follow up with PCP, neuro. We also discussed returning to the ED immediately if new or worsening sx occur. We discussed the sx which are most concerning (e.g., sudden worsening pain, fever, inability to tolerate by mouth) that necessitate immediate return. Medications administered to the patient during their visit and any new prescriptions provided to the patient are listed below.  Medications given during this visit Medications - No data to display   The patient appears reasonably screen and/or stabilized for discharge and I doubt any other medical condition or other [facility] requiring further screening, evaluation, or treatment in the ED at this time prior to discharge.          Final Clinical Impression(s) / ED Diagnoses Final diagnoses:  Paresthesia    Rx / DC Orders ED Discharge Orders          Ordered    Ambulatory referral to Neurology       Comments: New paresthesias of upper extremities   05/06/23 1205              Melene Plan, DO 05/06/23 1239

## 2023-05-06 NOTE — ED Triage Notes (Signed)
BIB spouse from home for intermittent R arm numbness, upper arm down to 5th digit, ulnar nerve distribution. Denies pain, tingling or pins and needles. Ongoing intermittently for last 3 weeks, worse at night and in the morning. Denies fall, injury, or other sx. Worse episode was last night. Alert, NAD, calm, interactive.

## 2023-05-06 NOTE — Telephone Encounter (Signed)
Copied from CRM [id number]. Topic: Clinical - Red Word Triage >> May 06, 2023  7:55 AM [place] A wrote: [facility] that prompted transfer to Nurse Triage: Patient's wife called he is having numbness in his right arm; NO pain;  Chief Complaint: right arm numbness Symptoms: numbness Frequency: constant Pertinent Negatives: Patient denies fever, pain, weakness Disposition: [] ED /[] Urgent Care (no appt availability in office) / [x] Appointment(In office/virtual)/ []  [facility]/ [] Home Care/ [] Refused Recommended Disposition /[] [place] [facility]/ []  Follow-up with PCP Additional Notes: apt made for today.  Care advice given, denies questions, instructed to go to the er if becomes worse.   Reason for Disposition  [1] Numbness (i.e., loss of sensation) of the face, arm / hand, or leg / foot on one side of the body AND [2] gradual onset (e.g., days to weeks) AND [3] present now  Answer Assessment - Initial Assessment Questions 1. SYMPTOM: "What is the main symptom you are concerned about?" (e.g., weakness, numbness)     Numbness to right arm for about two weeks 2. ONSET: "When did this start?" (minutes, hours, days; while sleeping)     week ago 3. LAST NORMAL: "When was the last time you (the patient) were normal (no symptoms)?"     About "a week ago" 4. PATTERN "Does this come and go, or has it been constant since it started?"  "Is it present now?"     constant 5. CARDIAC SYMPTOMS: "Have you had any of the following symptoms: chest pain, difficulty breathing, palpitations?"     denies 6. NEUROLOGIC SYMPTOMS: "Have you had any of the following symptoms: headache, dizziness, vision loss, double vision, changes in speech, unsteady on your feet?"     denies 7. OTHER SYMPTOMS: "Do you have any other symptoms?"     Denies. 8. PREGNANCY: "Is there any chance you are pregnant?" "When was your last menstrual period?"     na  Protocols used: Neurologic Deficit-A-AH

## 2023-05-06 NOTE — Discharge Instructions (Signed)
Most commonly your symptoms would be due to a nerve compression.  I think is these continue to happen when you wake up it is probably due to a position that you are sleeping on.  Might be worth considering changing your pillow.  I placed a referral to the neurologist.  They should call you to set up an appointment.  Please fill with your family doctor as well.  Please return for weakness to the extremity.

## 2023-05-14 ENCOUNTER — Ambulatory Visit: Payer: Medicare PPO

## 2023-05-25 ENCOUNTER — Ambulatory Visit (HOSPITAL_COMMUNITY): Payer: Medicare PPO | Attending: Cardiovascular Disease

## 2023-05-25 DIAGNOSIS — I25118 Atherosclerotic heart disease of native coronary artery with other forms of angina pectoris: Secondary | ICD-10-CM | POA: Diagnosis not present

## 2023-05-25 LAB — ECHOCARDIOGRAM COMPLETE
Area-P 1/2: 3.51 cm2
S' Lateral: 2.9 cm

## 2023-07-13 ENCOUNTER — Other Ambulatory Visit: Payer: Self-pay | Admitting: Internal Medicine

## 2023-07-13 DIAGNOSIS — I10 Essential (primary) hypertension: Secondary | ICD-10-CM

## 2023-09-20 DIAGNOSIS — C61 Malignant neoplasm of prostate: Secondary | ICD-10-CM | POA: Diagnosis not present

## 2023-09-20 LAB — PSA: PSA: 0.26

## 2023-09-27 DIAGNOSIS — R351 Nocturia: Secondary | ICD-10-CM | POA: Diagnosis not present

## 2023-09-27 DIAGNOSIS — N401 Enlarged prostate with lower urinary tract symptoms: Secondary | ICD-10-CM | POA: Diagnosis not present

## 2023-09-27 DIAGNOSIS — C61 Malignant neoplasm of prostate: Secondary | ICD-10-CM | POA: Diagnosis not present

## 2023-09-29 ENCOUNTER — Encounter: Payer: Self-pay | Admitting: Internal Medicine

## 2023-10-04 ENCOUNTER — Other Ambulatory Visit: Payer: Self-pay | Admitting: Internal Medicine

## 2023-10-05 ENCOUNTER — Other Ambulatory Visit: Payer: Self-pay | Admitting: Internal Medicine

## 2023-11-04 ENCOUNTER — Other Ambulatory Visit: Payer: Self-pay | Admitting: Internal Medicine

## 2023-11-04 DIAGNOSIS — I10 Essential (primary) hypertension: Secondary | ICD-10-CM

## 2023-12-06 ENCOUNTER — Other Ambulatory Visit: Payer: Self-pay | Admitting: Internal Medicine

## 2023-12-06 DIAGNOSIS — I10 Essential (primary) hypertension: Secondary | ICD-10-CM

## 2024-01-01 ENCOUNTER — Other Ambulatory Visit: Payer: Self-pay | Admitting: Internal Medicine

## 2024-01-01 DIAGNOSIS — I10 Essential (primary) hypertension: Secondary | ICD-10-CM

## 2024-01-06 ENCOUNTER — Other Ambulatory Visit: Payer: Self-pay | Admitting: Internal Medicine

## 2024-01-06 DIAGNOSIS — I10 Essential (primary) hypertension: Secondary | ICD-10-CM

## 2024-01-10 ENCOUNTER — Telehealth: Payer: Self-pay | Admitting: Internal Medicine

## 2024-01-10 NOTE — Telephone Encounter (Unsigned)
 Copied from CRM #[id number]. Topic: Clinical - Medication Refill >> Jan 10, 2024 12:56 PM Alfonso HERO wrote: Medication: amLODipine  (NORVASC ) 5 MG tablet metoprolol  succinate (TOPROL -XL) 25 MG 24 hr tablet rosuvastatin  (CRESTOR ) 10 MG tablet clopidogrel  (PLAVIX ) 75 MG tablet   Has the patient contacted their pharmacy? Yes (Agent: If no, request that the patient contact the pharmacy for the refill. If patient does not wish to contact the pharmacy document the reason why and proceed with request.) (Agent: If yes, when and what did the pharmacy advise?)  This is the patient's preferred pharmacy:  [facility] [address] ([address]), [place] - [address] [place] ([place]) [place] [address] Phone: [phone] Fax: [phone]  Is this the correct pharmacy for this prescription? Yes If no, delete pharmacy and type the correct one.   Has the prescription been filled recently? Yes  Is the patient out of the medication? No  Has the patient been seen for an appointment in the last year OR does the patient have an upcoming appointment? Yes  Can we respond through MyChart? Yes  Agent: Please be advised that Rx refills may take up to 3 business days. We ask that you follow-up with your pharmacy.  Please call patient because they dont know the name of another medication that needs to be filled.

## 2024-01-12 ENCOUNTER — Other Ambulatory Visit: Payer: Self-pay | Admitting: Internal Medicine

## 2024-02-05 ENCOUNTER — Other Ambulatory Visit: Payer: Self-pay | Admitting: Internal Medicine

## 2024-02-05 DIAGNOSIS — I10 Essential (primary) hypertension: Secondary | ICD-10-CM

## 2024-02-08 ENCOUNTER — Other Ambulatory Visit: Payer: Self-pay | Admitting: Internal Medicine

## 2024-03-03 ENCOUNTER — Other Ambulatory Visit: Payer: Self-pay | Admitting: Internal Medicine

## 2024-03-08 ENCOUNTER — Other Ambulatory Visit: Payer: Self-pay | Admitting: Internal Medicine

## 2024-03-21 ENCOUNTER — Encounter: Admitting: Internal Medicine

## 2024-04-10 ENCOUNTER — Other Ambulatory Visit: Payer: Self-pay | Admitting: Internal Medicine

## 2024-04-10 DIAGNOSIS — I10 Essential (primary) hypertension: Secondary | ICD-10-CM
